# Patient Record
Sex: Female | Born: 1960 | Race: White | Hispanic: No | Marital: Single | Smoking: Current every day smoker
Health system: Southern US, Community
[De-identification: ages and names within clinical notes are randomized; demographics above are authoritative.]

## PROBLEM LIST (undated history)

## (undated) DIAGNOSIS — E079 Disorder of thyroid, unspecified: Secondary | ICD-10-CM

## (undated) DIAGNOSIS — Z9889 Other specified postprocedural states: Secondary | ICD-10-CM

## (undated) DIAGNOSIS — E039 Hypothyroidism, unspecified: Secondary | ICD-10-CM

## (undated) DIAGNOSIS — I10 Essential (primary) hypertension: Secondary | ICD-10-CM

## (undated) DIAGNOSIS — E0590 Thyrotoxicosis, unspecified without thyrotoxic crisis or storm: Secondary | ICD-10-CM

## (undated) DIAGNOSIS — R112 Nausea with vomiting, unspecified: Secondary | ICD-10-CM

## (undated) HISTORY — PX: NM THYROID STIM SUPRESS: HXRAD602

## (undated) HISTORY — PX: TUBAL LIGATION: SHX77

---

## 2002-08-19 ENCOUNTER — Emergency Department (HOSPITAL_COMMUNITY): Admission: EM | Admit: 2002-08-19 | Discharge: 2002-08-19 | Payer: Self-pay | Admitting: *Deleted

## 2003-08-28 ENCOUNTER — Emergency Department (HOSPITAL_COMMUNITY): Admission: EM | Admit: 2003-08-28 | Discharge: 2003-08-28 | Payer: Self-pay | Admitting: Emergency Medicine

## 2004-05-05 ENCOUNTER — Ambulatory Visit (HOSPITAL_COMMUNITY): Admission: RE | Admit: 2004-05-05 | Discharge: 2004-05-05 | Payer: Self-pay | Admitting: Family Medicine

## 2004-06-18 ENCOUNTER — Emergency Department (HOSPITAL_COMMUNITY): Admission: EM | Admit: 2004-06-18 | Discharge: 2004-06-18 | Payer: Self-pay | Admitting: Emergency Medicine

## 2005-03-17 ENCOUNTER — Encounter (HOSPITAL_COMMUNITY): Admission: RE | Admit: 2005-03-17 | Discharge: 2005-03-18 | Payer: Self-pay | Admitting: Family Medicine

## 2005-04-27 ENCOUNTER — Ambulatory Visit (HOSPITAL_COMMUNITY): Admission: RE | Admit: 2005-04-27 | Discharge: 2005-04-27 | Payer: Self-pay | Admitting: Family Medicine

## 2005-05-19 ENCOUNTER — Ambulatory Visit: Payer: Self-pay | Admitting: Endocrinology

## 2005-06-16 ENCOUNTER — Ambulatory Visit: Payer: Self-pay | Admitting: Endocrinology

## 2005-08-03 ENCOUNTER — Ambulatory Visit: Payer: Self-pay | Admitting: Endocrinology

## 2005-08-17 ENCOUNTER — Ambulatory Visit: Payer: Self-pay | Admitting: Endocrinology

## 2005-09-01 ENCOUNTER — Ambulatory Visit (HOSPITAL_COMMUNITY): Admission: RE | Admit: 2005-09-01 | Discharge: 2005-09-01 | Payer: Self-pay | Admitting: Family Medicine

## 2005-11-12 ENCOUNTER — Ambulatory Visit: Payer: Self-pay | Admitting: Endocrinology

## 2005-11-23 ENCOUNTER — Emergency Department (HOSPITAL_COMMUNITY): Admission: EM | Admit: 2005-11-23 | Discharge: 2005-11-23 | Payer: Self-pay | Admitting: Emergency Medicine

## 2005-11-25 ENCOUNTER — Ambulatory Visit: Payer: Self-pay | Admitting: Endocrinology

## 2005-12-07 ENCOUNTER — Encounter (HOSPITAL_COMMUNITY): Admission: RE | Admit: 2005-12-07 | Discharge: 2006-01-06 | Payer: Self-pay | Admitting: Endocrinology

## 2006-03-22 ENCOUNTER — Emergency Department (HOSPITAL_COMMUNITY): Admission: EM | Admit: 2006-03-22 | Discharge: 2006-03-22 | Payer: Self-pay | Admitting: Emergency Medicine

## 2006-04-13 ENCOUNTER — Ambulatory Visit (HOSPITAL_COMMUNITY): Admission: RE | Admit: 2006-04-13 | Discharge: 2006-04-13 | Payer: Self-pay | Admitting: Family Medicine

## 2006-05-31 ENCOUNTER — Ambulatory Visit (HOSPITAL_COMMUNITY): Admission: RE | Admit: 2006-05-31 | Discharge: 2006-05-31 | Payer: Self-pay | Admitting: Family Medicine

## 2006-07-01 ENCOUNTER — Ambulatory Visit: Payer: Self-pay | Admitting: Endocrinology

## 2006-07-01 LAB — CONVERTED CEMR LAB
Free T4: 1.4 ng/dL (ref 0.9–1.8)
TSH: 0.05 microintl units/mL — ABNORMAL LOW (ref 0.35–5.50)

## 2006-07-12 ENCOUNTER — Ambulatory Visit: Payer: Self-pay | Admitting: Endocrinology

## 2006-07-26 ENCOUNTER — Encounter (HOSPITAL_COMMUNITY): Admission: RE | Admit: 2006-07-26 | Discharge: 2006-08-15 | Payer: Self-pay | Admitting: Endocrinology

## 2006-12-11 ENCOUNTER — Emergency Department (HOSPITAL_COMMUNITY): Admission: EM | Admit: 2006-12-11 | Discharge: 2006-12-12 | Payer: Self-pay | Admitting: Emergency Medicine

## 2007-03-28 ENCOUNTER — Encounter: Payer: Self-pay | Admitting: Endocrinology

## 2007-03-28 DIAGNOSIS — I10 Essential (primary) hypertension: Secondary | ICD-10-CM | POA: Insufficient documentation

## 2007-05-30 ENCOUNTER — Encounter: Payer: Self-pay | Admitting: Endocrinology

## 2007-05-30 ENCOUNTER — Ambulatory Visit: Payer: Self-pay | Admitting: Endocrinology

## 2007-05-30 DIAGNOSIS — K219 Gastro-esophageal reflux disease without esophagitis: Secondary | ICD-10-CM | POA: Insufficient documentation

## 2007-05-30 DIAGNOSIS — E0590 Thyrotoxicosis, unspecified without thyrotoxic crisis or storm: Secondary | ICD-10-CM | POA: Insufficient documentation

## 2007-05-30 LAB — CONVERTED CEMR LAB
Alkaline Phosphatase: 45 units/L (ref 39–117)
Free T4: 0.8 ng/dL (ref 0.6–1.6)
TSH: 1.18 microintl units/mL (ref 0.35–5.50)

## 2007-06-01 ENCOUNTER — Telehealth: Payer: Self-pay | Admitting: Endocrinology

## 2007-06-02 ENCOUNTER — Ambulatory Visit (HOSPITAL_COMMUNITY): Admission: RE | Admit: 2007-06-02 | Discharge: 2007-06-02 | Payer: Self-pay | Admitting: Family Medicine

## 2007-06-06 ENCOUNTER — Encounter: Admission: RE | Admit: 2007-06-06 | Discharge: 2007-06-06 | Payer: Self-pay | Admitting: Endocrinology

## 2007-09-03 ENCOUNTER — Emergency Department (HOSPITAL_COMMUNITY): Admission: EM | Admit: 2007-09-03 | Discharge: 2007-09-03 | Payer: Self-pay | Admitting: Emergency Medicine

## 2007-10-17 ENCOUNTER — Emergency Department (HOSPITAL_COMMUNITY): Admission: EM | Admit: 2007-10-17 | Discharge: 2007-10-17 | Payer: Self-pay | Admitting: Emergency Medicine

## 2007-10-20 ENCOUNTER — Ambulatory Visit: Payer: Self-pay | Admitting: Endocrinology

## 2007-10-23 LAB — CONVERTED CEMR LAB: TSH: 1.22 microintl units/mL (ref 0.35–5.50)

## 2008-01-24 ENCOUNTER — Emergency Department (HOSPITAL_COMMUNITY): Admission: EM | Admit: 2008-01-24 | Discharge: 2008-01-24 | Payer: Self-pay | Admitting: Emergency Medicine

## 2008-05-29 ENCOUNTER — Emergency Department (HOSPITAL_COMMUNITY): Admission: EM | Admit: 2008-05-29 | Discharge: 2008-05-30 | Payer: Self-pay | Admitting: Emergency Medicine

## 2008-06-28 ENCOUNTER — Ambulatory Visit (HOSPITAL_COMMUNITY): Admission: RE | Admit: 2008-06-28 | Discharge: 2008-06-28 | Payer: Self-pay | Admitting: Family Medicine

## 2008-11-05 ENCOUNTER — Encounter: Payer: Self-pay | Admitting: Endocrinology

## 2008-11-28 ENCOUNTER — Ambulatory Visit: Payer: Self-pay | Admitting: Endocrinology

## 2008-11-28 DIAGNOSIS — E042 Nontoxic multinodular goiter: Secondary | ICD-10-CM | POA: Insufficient documentation

## 2008-12-05 ENCOUNTER — Encounter (HOSPITAL_COMMUNITY): Admission: RE | Admit: 2008-12-05 | Discharge: 2009-01-04 | Payer: Self-pay | Admitting: Endocrinology

## 2008-12-06 ENCOUNTER — Telehealth (INDEPENDENT_AMBULATORY_CARE_PROVIDER_SITE_OTHER): Payer: Self-pay | Admitting: *Deleted

## 2008-12-17 ENCOUNTER — Ambulatory Visit: Payer: Self-pay | Admitting: Endocrinology

## 2009-01-07 ENCOUNTER — Telehealth (INDEPENDENT_AMBULATORY_CARE_PROVIDER_SITE_OTHER): Payer: Self-pay | Admitting: *Deleted

## 2009-02-28 ENCOUNTER — Ambulatory Visit: Payer: Self-pay | Admitting: Endocrinology

## 2009-02-28 LAB — CONVERTED CEMR LAB
Free T4: 0.2 ng/dL — ABNORMAL LOW (ref 0.6–1.6)
TSH: 24.66 microintl units/mL — ABNORMAL HIGH (ref 0.35–5.50)

## 2009-03-06 ENCOUNTER — Emergency Department (HOSPITAL_COMMUNITY): Admission: EM | Admit: 2009-03-06 | Discharge: 2009-03-06 | Payer: Self-pay | Admitting: Emergency Medicine

## 2009-04-03 ENCOUNTER — Ambulatory Visit: Payer: Self-pay | Admitting: Internal Medicine

## 2009-04-03 DIAGNOSIS — E018 Other iodine-deficiency related thyroid disorders and allied conditions: Secondary | ICD-10-CM | POA: Insufficient documentation

## 2009-04-03 DIAGNOSIS — R5381 Other malaise: Secondary | ICD-10-CM | POA: Insufficient documentation

## 2009-04-03 DIAGNOSIS — R5383 Other fatigue: Secondary | ICD-10-CM

## 2009-04-04 LAB — CONVERTED CEMR LAB
ALT: 25 units/L (ref 0–35)
AST: 28 units/L (ref 0–37)
Albumin: 4.5 g/dL (ref 3.5–5.2)
Alkaline Phosphatase: 53 units/L (ref 39–117)
BUN: 13 mg/dL (ref 6–23)
Basophils Absolute: 0 10*3/uL (ref 0.0–0.1)
Basophils Relative: 0.1 % (ref 0.0–3.0)
Bilirubin Urine: NEGATIVE
Bilirubin, Direct: 0 mg/dL (ref 0.0–0.3)
CO2: 25 meq/L (ref 19–32)
Calcium: 9.3 mg/dL (ref 8.4–10.5)
Chloride: 106 meq/L (ref 96–112)
Creatinine, Ser: 1 mg/dL (ref 0.4–1.2)
Eosinophils Absolute: 0.1 10*3/uL (ref 0.0–0.7)
Eosinophils Relative: 1.4 % (ref 0.0–5.0)
GFR calc non Af Amer: 63.19 mL/min (ref >60)
Glucose, Bld: 83 mg/dL (ref 70–99)
HCT: 40.6 % (ref 36.0–46.0)
Hemoglobin, Urine: NEGATIVE
Hemoglobin: 13.7 g/dL (ref 12.0–15.0)
Ketones, ur: NEGATIVE mg/dL
Leukocytes, UA: NEGATIVE
Lymphocytes Relative: 20.5 % (ref 12.0–46.0)
Lymphs Abs: 1.3 10*3/uL (ref 0.7–4.0)
MCHC: 33.7 g/dL (ref 30.0–36.0)
MCV: 88.6 fL (ref 78.0–100.0)
Monocytes Absolute: 0.4 10*3/uL (ref 0.1–1.0)
Monocytes Relative: 7 % (ref 3.0–12.0)
Neutro Abs: 4.6 10*3/uL (ref 1.4–7.7)
Neutrophils Relative %: 71 % (ref 43.0–77.0)
Nitrite: NEGATIVE
Platelets: 145 10*3/uL — ABNORMAL LOW (ref 150.0–400.0)
Potassium: 3.9 meq/L (ref 3.5–5.1)
RBC: 4.58 M/uL (ref 3.87–5.11)
RDW: 17.6 % — ABNORMAL HIGH (ref 11.5–14.6)
Sodium: 138 meq/L (ref 135–145)
Specific Gravity, Urine: <=1.005 (ref 1.000–1.030)
TSH: 50.5 microintl units/mL — ABNORMAL HIGH (ref 0.35–5.50)
Total Bilirubin: 0.7 mg/dL (ref 0.3–1.2)
Total Protein, Urine: NEGATIVE mg/dL
Total Protein: 7.1 g/dL (ref 6.0–8.3)
Urine Glucose: NEGATIVE mg/dL
Urobilinogen, UA: 0.2 (ref 0.0–1.0)
WBC: 6.4 10*3/uL (ref 4.5–10.5)
pH: 6 (ref 5.0–8.0)

## 2009-04-28 ENCOUNTER — Ambulatory Visit: Payer: Self-pay | Admitting: Internal Medicine

## 2009-04-30 ENCOUNTER — Encounter (INDEPENDENT_AMBULATORY_CARE_PROVIDER_SITE_OTHER): Payer: Self-pay | Admitting: *Deleted

## 2009-05-01 LAB — CONVERTED CEMR LAB: TSH: 12.33 microintl units/mL — ABNORMAL HIGH (ref 0.35–5.50)

## 2009-10-09 ENCOUNTER — Ambulatory Visit (HOSPITAL_COMMUNITY): Admission: RE | Admit: 2009-10-09 | Discharge: 2009-10-09 | Payer: Self-pay | Admitting: Family Medicine

## 2009-11-03 ENCOUNTER — Other Ambulatory Visit: Admission: RE | Admit: 2009-11-03 | Discharge: 2009-11-03 | Payer: Self-pay | Admitting: Obstetrics & Gynecology

## 2010-06-23 ENCOUNTER — Encounter (HOSPITAL_COMMUNITY)
Admission: RE | Admit: 2010-06-23 | Discharge: 2010-07-23 | Payer: Self-pay | Source: Home / Self Care | Attending: Family Medicine | Admitting: Family Medicine

## 2010-06-30 ENCOUNTER — Ambulatory Visit (HOSPITAL_COMMUNITY): Admission: RE | Admit: 2010-06-30 | Discharge: 2010-06-30 | Payer: Self-pay | Admitting: Family Medicine

## 2010-07-07 ENCOUNTER — Ambulatory Visit (HOSPITAL_COMMUNITY): Admission: RE | Admit: 2010-07-07 | Payer: Self-pay | Admitting: Pediatrics

## 2010-07-24 ENCOUNTER — Encounter (HOSPITAL_COMMUNITY)
Admission: RE | Admit: 2010-07-24 | Discharge: 2010-08-23 | Payer: Self-pay | Source: Home / Self Care | Attending: Family Medicine | Admitting: Family Medicine

## 2010-08-20 ENCOUNTER — Emergency Department (HOSPITAL_COMMUNITY)
Admission: EM | Admit: 2010-08-20 | Discharge: 2010-08-20 | Payer: Self-pay | Source: Home / Self Care | Admitting: Emergency Medicine

## 2010-09-05 ENCOUNTER — Encounter: Payer: Self-pay | Admitting: Pediatrics

## 2010-09-06 ENCOUNTER — Encounter: Payer: Self-pay | Admitting: Endocrinology

## 2010-09-06 ENCOUNTER — Encounter: Payer: Self-pay | Admitting: Family Medicine

## 2010-09-07 ENCOUNTER — Encounter: Payer: Self-pay | Admitting: Endocrinology

## 2010-09-16 ENCOUNTER — Other Ambulatory Visit (HOSPITAL_COMMUNITY): Payer: Self-pay | Admitting: Pediatrics

## 2010-09-16 DIAGNOSIS — M542 Cervicalgia: Secondary | ICD-10-CM

## 2010-09-17 NOTE — Assessment & Plan Note (Signed)
   Vital Signs:  Patient Profile:   50 Years Old Female Weight:      216 pounds Temp:     97.4 degrees F Pulse rate:   75 / minute BP sitting:   151 / 99                 Visit Type:  f/u PCP:  mcgowen   History of Present Illness: patient was last seen here late in 2007.  She had a thyroid nuclear medicine scan showing diffuse uptake of 28% at 24 hours.  This is high normal. Symptomatically, she is having some palpitations, anxiety, and pain at her anterior neck.  She states that this pain may even be coming from her collarbones.she is currently taking metoprolol 25 mg twice a day   Review of Systems  The patient denies fever.    Current Allergies: No known allergies   Past Medical History:    Reviewed history from 03/28/2007 and no changes required:       Hypertension       GERD     Review of Systems  The patient denies fever.     Vital Signs:  Patient Profile:   50 Years Old Female Weight:      216 pounds Temp:     97.4 degrees F Pulse rate:   75 / minute BP sitting:   151 / 99   Physical Exam  General:     obese.   Neck:     thyroid is slightly enlarged, with a multi-nodular surface. 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 Take 1 by mouth qd     Problem # 2:  neck pain not thyroid-related   Patient Instructions: 1)  recheck thyroid US 2)  ret 6 mos    ] Laboratory Results  Comments: tsh=1.18 free t4=0.8 alk phos=45

## 2010-09-17 NOTE — Assessment & Plan Note (Signed)
Summary: FU Natale Milch $50   Vital Signs:  Patient profile:   50 year old female Height:      69 inches Weight:      187 pounds BMI:     27.71 Temp:     97.0 degrees F oral Pulse rate:   94 / minute BP sitting:   120 / 64  (left arm) Cuff size:   regular  Vitals Entered By: Bill Salinas CMA (Dec 17, 2008 9:03 AM)  Primary Gatsby Chismar:  mcgowen   History of Present Illness: i-131 rx id scheduled for 12/26/08.  she had restarted the tapazole.  she still feels bad.  she has palpiations, discomfort at the anterior neck, fatigue, and tremor.  Current Medications (verified): 1)  Verapamil Hcl Cr 120 Mg Xr24h-Cap (Verapamil Hcl) .Marland Kitchen.. 1 Tab Bid 2)  Methimazole 10 Mg Tabs (Methimazole) .... 2-Bid 3)  Multivitamin .Marland Kitchen.. 1 Daily  Allergies (verified): No Known Drug Allergies  Past History:  Past Medical History:    Hypertension    GERD     (05/30/2007)  Review of Systems  The patient denies fever.    Physical Exam  General:  normal appearance.   Neck:  5x normal size, multinodular goiter, right > left Lungs:  Clear to auscultation bilaterally. Normal respiratory effort.  Skin:  not diaphoretic Psych:  Alert and cooperative; normal mood and affect; normal attention span and concentration.     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 1 daily  Other Orders: Est. Patient Level III (91478)  Patient Instructions: 1)  please stop methimazole. 2)  have i-131 treatment on 12/26/08, as scheduled. 3)  restart methimazole 3-4 days later. 4)  return here mid-june.

## 2010-09-17 NOTE — Letter (Signed)
Summary: Generic Letter  [facility]  [address] [place], [place] [address]   Phone: [phone]  Fax: [phone]    04/30/2009  Mallory Smith [address] [place], [place]  [address]  Dear [name],   We have attempted unsuccessful to contact about you about some important information from Dr. Jonny Ruiz:    Tests: (1) TSH (TSH)   FastTSH              [H]  12.33 uIU/mL                0.35-5.50    Please call our office if you have any questions,  Thank You.           Sincerely,   Margaret Pyle, CMA

## 2010-09-17 NOTE — Progress Notes (Signed)
  Phone Note Call from Patient Call back at [facility] Phone [phone]   Caller: Daughter/kenra [phone] Call For: dr ellsion Summary of Call: per Sylvan Cheese daughter think she may have a allergic reaction to METHIMAZOLE 10 MG TABS (METHIMAZOLE).  have very  bad itching. no rash what should she do? Initial call taken by: Shelbie Proctor,  Jan 07, 2009 1:29 PM  Follow-up for Phone Call        change to ptu 4x50 mg two times a day if ever you have fever while taking this medication, stop it and call us, because of the risk of a rare side-effect  Follow-up by: Minus Breeding MD,  Jan 07, 2009 2:41 PM  Additional Follow-up for Phone Call Additional follow up Details #1::        Called pt to inform spoke with pt made aware Additional Follow-up by: Shelbie Proctor,  Jan 07, 2009 3:10 PM    New/Updated Medications: PROPYLTHIOURACIL 50 MG TABS (PROPYLTHIOURACIL) 4 tabs bid   Prescriptions: PROPYLTHIOURACIL 50 MG TABS (PROPYLTHIOURACIL) 4 tabs bid  #240 x 1   Entered and Authorized by:   Minus Breeding MD   Signed by:   Minus Breeding MD on 01/07/2009   Method used:   Electronically to        [facility] [phone]* (retail)       [address]       [place], [place]  [address]       Ph: [phone] or [phone]       Fax: [phone]   RxID:   [id number]

## 2010-09-17 NOTE — Progress Notes (Signed)
  Phone Note Call from Patient Call back at Home Phone [phone]   Caller: Patient Call For: dr ellsion  Summary of Call: per Mallory Smith call need a rx for  thyroid called in to her pharmacy .Marland KitchenMarland Kitchenpls send to [facility] in [place] [phone] pt listen to her report on pt Initial call taken by: Shelbie Proctor,  December 06, 2008 3:12 PM  Follow-up for Phone Call        sent if ever you have fever while taking this medication, stop it and call us, because of the risk of a rare side-effect  Follow-up by: Minus Breeding MD,  December 06, 2008 3:27 PM  Additional Follow-up for Phone Call Additional follow up Details #1::        called pt to inform that rx was sent to the pharmacy Additional Follow-up by: Shelbie Proctor,  December 09, 2008 11:29 AM    New/Updated Medications: METHIMAZOLE 10 MG TABS (METHIMAZOLE) 2-bid   Prescriptions: METHIMAZOLE 10 MG TABS (METHIMAZOLE) 2-bid  #120 x 1   Entered and Authorized by:   Minus Breeding MD   Signed by:   Minus Breeding MD on 12/06/2008   Method used:   Electronically to        [facility] [phone]* (retail)       [address]       [place], [place]  [address]       Ph: [phone] or [phone]       Fax: [phone]   RxID:   [id number]

## 2010-09-17 NOTE — Progress Notes (Signed)
Summary: results  Phone Note Call from Patient Call back at Home Phone [phone]   Caller: Patient Reason for Call: Lab or Test Results Summary of Call: Per pt calling to request her bone density results and lab test, only heard a thyroid report on phone tree from dr Everardo All Initial call taken by: Shelbie Proctor,  June 01, 2007 11:35 AM  Follow-up for Phone Call        on phone tree         Appended Document: results lm requarding  results on phone tree. called pt 06-02-07 @ 7:35am

## 2010-09-17 NOTE — Assessment & Plan Note (Signed)
Summary: FU  $50  D/T  STC   Vital Signs:  Patient profile:   50 year old female Height:      69 inches Weight:      188 pounds BMI:     27.86 O2 Sat:      98 % Temp:     96.9 degrees F oral Pulse rate:   105 / minute BP sitting:   130 / 82  (left arm) Cuff size:   regular  Vitals Entered By: Bill Salinas CMA (November 28, 2008 8:54 AM)  Primary Provider:  mcgowen   History of Present Illness: pt states palpitations, weight loss, and associated tremor of the hands.   says she takes methimazole as rx'ed.  Current Medications (verified): 1)  Verapamil Hcl Cr 120 Mg Xr24h-Cap (Verapamil Hcl) .Marland Kitchen.. 1 Tab Bid  Allergies (verified): No Known Drug Allergies  Past History:  Past Medical History:    Hypertension    GERD     (05/30/2007)  Review of Systems  The patient denies fever.    Physical Exam  General:  Well developed, well nourished, in no acute distress.  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 1 tab bid  Other Orders: Radiology Referral (Radiology) Radiology Referral (Radiology) Est. Patient Level III ([address])  Patient Instructions: 1)  scan, with plan for i-131.  there is some risk of exacerbation of her hyperthyroidism with the i-131 rx, but the risk of allowing her tft to be this high is greater 2)  we'll resume the methimazole few days after the treatment 3)  if ever you have fever while taking this medication, stop it and call us, because of the risk of a rare side-effect.

## 2010-09-17 NOTE — Assessment & Plan Note (Signed)
Summary: FU / NWS $50--rs' from bmp list/cd   Vital Signs:  Patient profile:   50 year old female Height:      69 inches Weight:      201.56 pounds BMI:     29.87 O2 Sat:      97 % Temp:     97.2 degrees F oral Pulse rate:   71 / minute BP sitting:   138 / 100  (right arm) Cuff size:   regular  Vitals Entered By: Windell Norfolk (April 03, 2009 9:24 AM) CC: discuss thyroid and BP   Primary Care Provider:  mcgowen  CC:  discuss thyroid and BP.  History of Present Illness: normally sees dr Everardo All who is out of the office due to new addition to the family; recent TSH elevated and thyroid med stopped; now with increasing fatigue, gained 20 lbs with less activitiy as well, denies OSA symptoms, Pt denies CP, sob, doe, wheezing, orthopnea, pnd, worsening LE edema, palps, dizziness or syncope Pt denies new neuro symptoms such as headache, facial or extremity weakness, although she does have some daytime somnolence (not clear if might be OSA) and some intermittent heavy menses recently she thinks maybe due to hormone changes.  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 1 Tab Bid 2)  Multivitamin .Marland Kitchen.. 1 Daily  Current Medications (verified): 1)  Losartan Potassium-Hctz 50-12.5 Mg Tabs (Losartan Potassium-Hctz) .Marland Kitchen.. 1 By Mouth Once Daily 2)  Multivitamin .Marland Kitchen.. 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 Married 2 daughters  Review of Systems       all otherwise negative - 12 system review done   Physical Exam  General:  alert and overweight-appearing.   Head:  normocephalic and atraumatic.   Eyes:  vision grossly intact, pupils equal, and pupils round.   Ears:  R ear normal and L ear normal.   Nose:  no external deformity and no nasal discharge.   Mouth:  no gingival abnormalities and pharynx pink and moist.   Neck:  supple and no masses.   Lungs:  normal respiratory effort and normal breath sounds.   Heart:  normal rate and regular rhythm.   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)  Orders: TLB-TSH (Thyroid Stimulating Hormone) (84443-TSH) will check TSH but likely needs med, will most likely need to start and f/u TSH 4 wks  Problem # 2:  FATIGUE (ICD-780.79)  to check labs - r/o anemia with recent irreg and heavy menses, TSH with above, blood sugar with the wt gain  Orders: TLB-BMP (Basic Metabolic Panel-BMET) (80048-METABOL) TLB-CBC Platelet - w/Differential (85025-CBCD) TLB-Hepatic/Liver Function Pnl (80076-HEPATIC) TLB-Udip w/ Micro (81001-URINE)  Problem # 3:  HYPERTENSION (ICD-401.9)  Her updated medication list for this problem includes:    Losartan Potassium-hctz 50-12.5 Mg Tabs (  Losartan potassium-hctz) .Marland Kitchen... 1 by mouth once daily  BP today: 138/100 Prior BP: 124/82 (02/28/2009) stable overall by hx and exam, ok to continue meds/tx as is   Complete Medication List: 1)  Losartan Potassium-hctz 50-12.5 Mg Tabs (Losartan potassium-hctz) .Marland Kitchen.. 1 by mouth once daily 2)  Multivitamin  .Marland Kitchen.. 1 daily  Patient Instructions: 1)  Please take all new medications as prescribed 2)  Please go to the Lab in the basement for  your blood and/or urine tests today 3)  Continue all previous medications as before this visit  4)  Please schedule an appointment with your primary doctor in 3 months 5)  Check your Blood Pressure regularly. If it is above 140/90: you should make an appointment. Prescriptions: LOSARTAN POTASSIUM-HCTZ 50-12.5 MG TABS (LOSARTAN POTASSIUM-HCTZ) 1 by mouth once daily  #30 x 11   Entered and Authorized by:   Corwin Levins MD   Signed by:   Corwin Levins MD on 04/03/2009   Method used:   Print then Give to Patient   RxID:   [id number]

## 2010-09-17 NOTE — Letter (Signed)
Summary: Out of Work  [facility]  [address] [place], [place] [address]   Phone: [phone]  Fax: [phone]    Dec 17, 2008   Employee:  Stephan Minister    To Whom It May Concern:   For Medical reasons, please excuse the above named employee from work for the following dates:  Start:   12/26/08  End:   12/30/08  If you need additional information, please feel free to contact our office.         Sincerely,    Minus Breeding MD

## 2010-09-17 NOTE — Assessment & Plan Note (Signed)
Summary: ER FU/NWS  $50   Vital Signs:  Patient Profile:   50 Years Old Female Weight:      214.6 pounds Temp:     97.6 degrees F oral Pulse rate:   69 / minute BP sitting:   159 / 89  (right arm) Cuff size:   large  Pt. in pain?   no  Vitals Entered By: Orlan Leavens (October 20, 2007 10:39 AM)                  PCP:  mcgowen  Chief Complaint:  ER FOLLOW-UP/DISCUSS MED.  History of Present Illness: patient was seen in the emergency room several days ago, with palpitations in the chest.  The symptoms persist.  she has lost few lbs since last ov.  pt  has taken herself off and on the beta blocker.    Current Allergies: No known allergies   Past Medical History:    Reviewed history from 05/30/2007 and no changes required:       Hypertension       GERD     Review of Systems  The patient denies fever and syncope.     Physical Exam  General:     healthy appearing.   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 Take 1 two times a day once daily  Orders: TLB-TSH (Thyroid Stimulating Hormone) (84443-TSH) Est. Patient Level III ([address])   Medications Added to Medication List This Visit: 1)  Metoprolol Tartrate 25 Mg Tabs (Metoprolol tartrate) .... Take 1 two times a day once daily   Patient Instructions: 1)  i told pt there is no thyroid reason to take beta-blocker. 2)  ret  6 mos 3)  cc dr Marvel Plan    ]  Appended Document: ER FU/NWS  $50 FAXED NOTES TO DR [facility] @ [phone]/LMB

## 2010-09-17 NOTE — Letter (Signed)
Summary: Generic Letter  [facility]  [address] [place], [place] [address]   Phone: [phone]  Fax: [phone]    04/30/2009  Mallory Smith [address] [place], [place]  [address]  Dear Ms. Mallory Smith,  We have attempted unsuccessfully to contact you about some very important information from Dr.John:   Test:   (1)   TSH   (TSH)    FastTSH         [H]   12.33  uIU/mL     0.35-5.50    1)   increase the thyroid med to 137 micrograms per day         2)   re-check TSH 4 wks  244.8   Pkease call our office if you have any questions, Thank You.       Sincerely,   Margaret Pyle, CMA

## 2010-09-17 NOTE — Assessment & Plan Note (Signed)
Summary: F/U NUCLEAR TEST/F/U APPT$50/CD-PER PT RS STC   Vital Signs:  Patient profile:   50 year old female Height:      69 inches Weight:      196 pounds BMI:     29.05 Temp:     97.5 degrees F oral Pulse rate:   82 / minute BP sitting:   124 / 82  (left arm) Cuff size:   regular  Vitals Entered By: Bill Salinas CMA (February 28, 2009 1:55 PM) CC: pt here for fu after radiation therapy and c/o fatigue ab   Primary Provider:  mcgowen  CC:  pt here for fu after radiation therapy and c/o fatigue ab.  History of Present Illness: see above.  now 8 weeks s/p i-131 rx for hyperthyroidism due to a multinodular goiter.  she feels as though she is retaining fluid.  Current Medications (verified): 1)  Verapamil Hcl Cr 120 Mg Xr24h-Cap (Verapamil Hcl) .Marland Kitchen.. 1 Tab Bid 2)  Multivitamin .Marland Kitchen.. 1 Daily 3)  Propylthiouracil 50 Mg Tabs (Propylthiouracil) .... 4 Tabs Bid 4)  Methimazole 10 Mg Tabs (Methimazole) .Marland Kitchen.. 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  The patient denies fever.    Physical Exam  General:  normal appearance.   Neck:  3x normal size, multinodular goiter, right > left Neurologic:  no tremor Skin:  not diaphoretic Psych:  Alert and cooperative; normal mood and affect; normal attention span and concentration.     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 1 tab two times a day  Other Orders: TLB-TSH (Thyroid Stimulating Hormone) (84443-TSH) TLB-T4 (Thyrox), Free  [phone]) Est. Patient Level III ([address])  Patient Instructions: 1)  Please schedule a follow-up appointment in 1 month. 2)  tests are being ordered for you today.  a few days after the test(s), please call [phone] to hear your test results.  this is very important to do because the results may change the instructions you see here 3)  stop methimazole 4)  (update: i left message on phone-tree:  rx as we discussed)

## 2010-09-21 ENCOUNTER — Ambulatory Visit (HOSPITAL_COMMUNITY)
Admission: RE | Admit: 2010-09-21 | Discharge: 2010-09-21 | Disposition: A | Discharge status: Home / Self Care | Payer: Commercial Managed Care - PPO | Source: Ambulatory Visit | Attending: Pediatrics | Admitting: Pediatrics

## 2010-09-21 DIAGNOSIS — M542 Cervicalgia: Secondary | ICD-10-CM | POA: Insufficient documentation

## 2010-09-21 DIAGNOSIS — M5380 Other specified dorsopathies, site unspecified: Secondary | ICD-10-CM | POA: Insufficient documentation

## 2010-10-15 ENCOUNTER — Ambulatory Visit (HOSPITAL_COMMUNITY)
Admission: RE | Admit: 2010-10-15 | Discharge: 2010-10-15 | Disposition: A | Discharge status: Home / Self Care | Payer: Commercial Managed Care - PPO | Source: Ambulatory Visit | Attending: Pediatrics | Admitting: Pediatrics

## 2010-10-15 DIAGNOSIS — M6281 Muscle weakness (generalized): Secondary | ICD-10-CM | POA: Insufficient documentation

## 2010-10-15 DIAGNOSIS — M546 Pain in thoracic spine: Secondary | ICD-10-CM | POA: Insufficient documentation

## 2010-10-15 DIAGNOSIS — IMO0001 Reserved for inherently not codable concepts without codable children: Secondary | ICD-10-CM | POA: Insufficient documentation

## 2010-10-19 ENCOUNTER — Ambulatory Visit (HOSPITAL_COMMUNITY)
Admission: RE | Admit: 2010-10-19 | Discharge: 2010-10-19 | Disposition: A | Discharge status: Home / Self Care | Payer: Commercial Managed Care - PPO | Source: Ambulatory Visit | Attending: *Deleted | Admitting: *Deleted

## 2010-10-21 ENCOUNTER — Ambulatory Visit (HOSPITAL_COMMUNITY): Payer: Commercial Managed Care - PPO | Admitting: *Deleted

## 2010-10-23 ENCOUNTER — Ambulatory Visit (HOSPITAL_COMMUNITY)
Admission: RE | Admit: 2010-10-23 | Discharge: 2010-10-23 | Disposition: A | Discharge status: Home / Self Care | Payer: Commercial Managed Care - PPO | Source: Ambulatory Visit | Attending: Pediatrics | Admitting: Pediatrics

## 2010-10-26 ENCOUNTER — Ambulatory Visit (HOSPITAL_COMMUNITY)
Admission: RE | Admit: 2010-10-26 | Discharge: 2010-10-26 | Disposition: A | Discharge status: Home / Self Care | Payer: Commercial Managed Care - PPO | Source: Ambulatory Visit | Attending: Pediatrics | Admitting: Pediatrics

## 2010-10-29 ENCOUNTER — Ambulatory Visit (HOSPITAL_COMMUNITY)
Admission: RE | Admit: 2010-10-29 | Discharge: 2010-10-29 | Disposition: A | Discharge status: Home / Self Care | Payer: Commercial Managed Care - PPO | Source: Ambulatory Visit | Attending: *Deleted | Admitting: *Deleted

## 2010-10-30 ENCOUNTER — Ambulatory Visit (HOSPITAL_COMMUNITY)
Admission: RE | Admit: 2010-10-30 | Discharge: 2010-10-30 | Disposition: A | Discharge status: Home / Self Care | Payer: Commercial Managed Care - PPO | Source: Ambulatory Visit | Attending: *Deleted | Admitting: *Deleted

## 2010-11-02 ENCOUNTER — Ambulatory Visit (HOSPITAL_COMMUNITY)
Admission: RE | Admit: 2010-11-02 | Discharge: 2010-11-02 | Disposition: A | Discharge status: Home / Self Care | Payer: Commercial Managed Care - PPO | Source: Ambulatory Visit | Attending: *Deleted | Admitting: *Deleted

## 2010-11-05 ENCOUNTER — Ambulatory Visit (HOSPITAL_COMMUNITY): Payer: Commercial Managed Care - PPO

## 2010-11-06 ENCOUNTER — Ambulatory Visit (HOSPITAL_COMMUNITY): Payer: Commercial Managed Care - PPO

## 2010-11-09 ENCOUNTER — Ambulatory Visit (HOSPITAL_COMMUNITY)
Admission: RE | Admit: 2010-11-09 | Discharge: 2010-11-09 | Disposition: A | Discharge status: Home / Self Care | Payer: Commercial Managed Care - PPO | Source: Ambulatory Visit | Attending: *Deleted | Admitting: *Deleted

## 2010-11-11 ENCOUNTER — Ambulatory Visit (HOSPITAL_COMMUNITY)
Admission: RE | Admit: 2010-11-11 | Discharge: 2010-11-11 | Disposition: A | Discharge status: Home / Self Care | Payer: Commercial Managed Care - PPO | Source: Ambulatory Visit | Attending: *Deleted | Admitting: *Deleted

## 2010-11-13 ENCOUNTER — Ambulatory Visit (HOSPITAL_COMMUNITY): Payer: Commercial Managed Care - PPO | Admitting: Physical Therapy

## 2010-11-16 ENCOUNTER — Ambulatory Visit (HOSPITAL_COMMUNITY)
Admission: RE | Admit: 2010-11-16 | Discharge: 2010-11-16 | Disposition: A | Discharge status: Home / Self Care | Payer: Commercial Managed Care - PPO | Source: Ambulatory Visit | Attending: Pediatrics | Admitting: Pediatrics

## 2010-11-16 DIAGNOSIS — M546 Pain in thoracic spine: Secondary | ICD-10-CM | POA: Insufficient documentation

## 2010-11-16 DIAGNOSIS — M6281 Muscle weakness (generalized): Secondary | ICD-10-CM | POA: Insufficient documentation

## 2010-11-16 DIAGNOSIS — IMO0001 Reserved for inherently not codable concepts without codable children: Secondary | ICD-10-CM | POA: Insufficient documentation

## 2010-11-17 ENCOUNTER — Ambulatory Visit (HOSPITAL_COMMUNITY)
Admission: RE | Admit: 2010-11-17 | Discharge: 2010-11-17 | Disposition: A | Discharge status: Home / Self Care | Payer: Commercial Managed Care - PPO | Source: Ambulatory Visit

## 2010-11-18 ENCOUNTER — Ambulatory Visit (HOSPITAL_COMMUNITY)
Admission: RE | Admit: 2010-11-18 | Discharge: 2010-11-18 | Disposition: A | Discharge status: Home / Self Care | Payer: Commercial Managed Care - PPO | Source: Ambulatory Visit | Attending: Pediatrics | Admitting: Pediatrics

## 2010-11-18 DIAGNOSIS — IMO0001 Reserved for inherently not codable concepts without codable children: Secondary | ICD-10-CM | POA: Insufficient documentation

## 2010-11-18 DIAGNOSIS — M546 Pain in thoracic spine: Secondary | ICD-10-CM | POA: Insufficient documentation

## 2010-11-18 DIAGNOSIS — M6281 Muscle weakness (generalized): Secondary | ICD-10-CM | POA: Insufficient documentation

## 2010-11-24 ENCOUNTER — Ambulatory Visit (HOSPITAL_COMMUNITY)
Admission: RE | Admit: 2010-11-24 | Discharge: 2010-11-24 | Disposition: A | Discharge status: Home / Self Care | Payer: Commercial Managed Care - PPO | Source: Ambulatory Visit | Attending: *Deleted | Admitting: *Deleted

## 2010-11-25 ENCOUNTER — Ambulatory Visit (HOSPITAL_COMMUNITY)
Admission: RE | Admit: 2010-11-25 | Discharge: 2010-11-25 | Disposition: A | Discharge status: Home / Self Care | Payer: Commercial Managed Care - PPO | Source: Ambulatory Visit

## 2010-11-26 ENCOUNTER — Ambulatory Visit (HOSPITAL_COMMUNITY): Payer: Commercial Managed Care - PPO

## 2010-12-01 ENCOUNTER — Ambulatory Visit (HOSPITAL_COMMUNITY)
Admission: RE | Admit: 2010-12-01 | Discharge: 2010-12-01 | Disposition: A | Discharge status: Home / Self Care | Payer: Commercial Managed Care - PPO | Source: Ambulatory Visit | Attending: Family Medicine | Admitting: Family Medicine

## 2010-12-03 ENCOUNTER — Ambulatory Visit (HOSPITAL_COMMUNITY): Payer: Commercial Managed Care - PPO

## 2010-12-08 ENCOUNTER — Ambulatory Visit (HOSPITAL_COMMUNITY)
Admission: RE | Admit: 2010-12-08 | Discharge: 2010-12-08 | Disposition: A | Discharge status: Home / Self Care | Payer: Commercial Managed Care - PPO | Source: Ambulatory Visit | Attending: Family Medicine | Admitting: Family Medicine

## 2010-12-10 ENCOUNTER — Ambulatory Visit (HOSPITAL_COMMUNITY)
Admission: RE | Admit: 2010-12-10 | Discharge: 2010-12-10 | Disposition: A | Discharge status: Home / Self Care | Payer: Commercial Managed Care - PPO | Source: Ambulatory Visit | Attending: Family Medicine | Admitting: Family Medicine

## 2010-12-11 ENCOUNTER — Ambulatory Visit (HOSPITAL_COMMUNITY): Payer: Commercial Managed Care - PPO

## 2010-12-15 ENCOUNTER — Ambulatory Visit (HOSPITAL_COMMUNITY): Payer: Commercial Managed Care - PPO

## 2010-12-17 ENCOUNTER — Ambulatory Visit (HOSPITAL_COMMUNITY)
Admission: RE | Admit: 2010-12-17 | Discharge: 2010-12-17 | Disposition: A | Discharge status: Home / Self Care | Payer: Commercial Managed Care - PPO | Source: Ambulatory Visit | Attending: Pediatrics | Admitting: Pediatrics

## 2010-12-17 DIAGNOSIS — M6281 Muscle weakness (generalized): Secondary | ICD-10-CM | POA: Insufficient documentation

## 2010-12-17 DIAGNOSIS — M546 Pain in thoracic spine: Secondary | ICD-10-CM | POA: Insufficient documentation

## 2010-12-17 DIAGNOSIS — IMO0001 Reserved for inherently not codable concepts without codable children: Secondary | ICD-10-CM | POA: Insufficient documentation

## 2011-01-01 NOTE — Consult Note (Signed)
[facility]                          ENDOCRINOLOGY CONSULTATION   NAME:Mallory Smith, Mallory Smith                         MRN:          [id number]  DATE:07/12/2006                            DOB:          Jan 04, 1961    REASON FOR VISIT:  Followup fibroid.   HISTORY OF PRESENT ILLNESS:  A 50 year old woman who was recently found  by Dr. Milinda Cave to have recurrent hyperthyroidism and was started about a  week ago on Tapazole 5 mg a day.  She has a few symptoms of tremor and  palpitations.   PAST MEDICAL HISTORY:  Hypertension, for which she takes Mavik.   REVIEW OF SYSTEMS:  She has lost 20 pounds over the past few months.   PHYSICAL EXAMINATION:  VITAL SIGNS:  Blood pressure 130/82, heart rate  79, temperature 97.1, weight 198.  GENERAL:  In no distress.  NECK:  Thyroid is 2 times normal size bilaterally with an irregular  surface.   LABORATORIES:  Laboratory studies forwards by Dr. Milinda Cave on June 28, 2006 - free T4 elevated at 2.02, TSH low at 0.009.   IMPRESSION:  Recurrent hyperthyroidism.   PLAN:  1. I discussed treatment options with the patient, and she states at      this time she would like to do iodine-131 therapy.  Therefore, she      will discontinue the Tapazole, and I will send her for a nuclear      medicine scan.  She would like to hold off on getting the treatment      until January of 2008 when her work schedule slows down.  2. Return about 2 weeks later.  3. Lopressor 25 mg twice a day to control symptoms in the meantime.     Sean A. Everardo All, MD  Electronically Signed    SAE/MedQ  DD: 07/12/2006  DT: 07/12/2006  Job #: [id number]   cc:   Jeoffrey Massed, MD

## 2011-05-06 LAB — URINALYSIS, ROUTINE W REFLEX MICROSCOPIC
Bilirubin Urine: NEGATIVE
Glucose, UA: NEGATIVE
Hgb urine dipstick: NEGATIVE
Ketones, ur: NEGATIVE
Nitrite: NEGATIVE
Protein, ur: NEGATIVE
Specific Gravity, Urine: <1.005 — ABNORMAL LOW
Urobilinogen, UA: 0.2
pH: 7

## 2011-05-10 LAB — CBC
HCT: 39.5
Hemoglobin: 13
MCHC: 32.9
MCV: 83.9
Platelets: 179
RBC: 4.71
RDW: 14.6
WBC: 8.2

## 2011-05-10 LAB — DIFFERENTIAL
Basophils Absolute: 0.1
Basophils Relative: 2 — ABNORMAL HIGH
Eosinophils Absolute: 0.1
Eosinophils Relative: 2
Lymphocytes Relative: 28
Lymphs Abs: 2.3
Monocytes Absolute: 0.7
Monocytes Relative: 8
Neutro Abs: 5
Neutrophils Relative %: 61

## 2011-05-10 LAB — COMPREHENSIVE METABOLIC PANEL
ALT: 14
AST: 17
Albumin: 4
Alkaline Phosphatase: 43
BUN: 12
CO2: 25
Calcium: 9
Chloride: 104
Creatinine, Ser: 1.07
GFR calc Af Amer: >60
GFR calc non Af Amer: 55 — ABNORMAL LOW
Glucose, Bld: 92
Potassium: 3.8
Sodium: 136
Total Bilirubin: 0.5
Total Protein: 6.9

## 2011-05-10 LAB — POCT CARDIAC MARKERS
CKMB, poc: 2
Myoglobin, poc: 67.2
Operator id: 217151
Troponin i, poc: <0.05

## 2011-10-13 ENCOUNTER — Other Ambulatory Visit (HOSPITAL_COMMUNITY): Payer: Self-pay | Admitting: Pediatrics

## 2011-10-13 DIAGNOSIS — Z9889 Other specified postprocedural states: Secondary | ICD-10-CM

## 2011-10-15 ENCOUNTER — Other Ambulatory Visit (HOSPITAL_COMMUNITY): Payer: Commercial Managed Care - PPO

## 2011-10-19 ENCOUNTER — Other Ambulatory Visit (HOSPITAL_COMMUNITY): Payer: Commercial Managed Care - PPO

## 2011-10-26 ENCOUNTER — Other Ambulatory Visit (HOSPITAL_COMMUNITY): Payer: Self-pay

## 2011-11-02 ENCOUNTER — Other Ambulatory Visit (HOSPITAL_COMMUNITY): Payer: Self-pay

## 2011-11-05 ENCOUNTER — Ambulatory Visit (HOSPITAL_COMMUNITY)
Admission: RE | Admit: 2011-11-05 | Discharge: 2011-11-05 | Disposition: A | Discharge status: Home / Self Care | Payer: 59 | Source: Ambulatory Visit | Attending: Pediatrics | Admitting: Pediatrics

## 2011-11-05 DIAGNOSIS — Z09 Encounter for follow-up examination after completed treatment for conditions other than malignant neoplasm: Secondary | ICD-10-CM | POA: Insufficient documentation

## 2011-11-05 DIAGNOSIS — Z9889 Other specified postprocedural states: Secondary | ICD-10-CM | POA: Insufficient documentation

## 2011-11-05 DIAGNOSIS — E042 Nontoxic multinodular goiter: Secondary | ICD-10-CM | POA: Insufficient documentation

## 2012-01-04 ENCOUNTER — Encounter (HOSPITAL_COMMUNITY): Payer: Self-pay

## 2012-01-04 ENCOUNTER — Emergency Department (HOSPITAL_COMMUNITY)
Admission: EM | Admit: 2012-01-04 | Discharge: 2012-01-04 | Disposition: A | Discharge status: Home / Self Care | Payer: 59 | Attending: Emergency Medicine | Admitting: Emergency Medicine

## 2012-01-04 ENCOUNTER — Emergency Department (HOSPITAL_COMMUNITY): Payer: 59

## 2012-01-04 DIAGNOSIS — R079 Chest pain, unspecified: Secondary | ICD-10-CM | POA: Insufficient documentation

## 2012-01-04 DIAGNOSIS — F17200 Nicotine dependence, unspecified, uncomplicated: Secondary | ICD-10-CM | POA: Insufficient documentation

## 2012-01-04 DIAGNOSIS — R05 Cough: Secondary | ICD-10-CM | POA: Insufficient documentation

## 2012-01-04 DIAGNOSIS — Z79899 Other long term (current) drug therapy: Secondary | ICD-10-CM | POA: Insufficient documentation

## 2012-01-04 DIAGNOSIS — I10 Essential (primary) hypertension: Secondary | ICD-10-CM | POA: Insufficient documentation

## 2012-01-04 DIAGNOSIS — M549 Dorsalgia, unspecified: Secondary | ICD-10-CM | POA: Insufficient documentation

## 2012-01-04 DIAGNOSIS — J3489 Other specified disorders of nose and nasal sinuses: Secondary | ICD-10-CM | POA: Insufficient documentation

## 2012-01-04 DIAGNOSIS — S20219A Contusion of unspecified front wall of thorax, initial encounter: Secondary | ICD-10-CM | POA: Insufficient documentation

## 2012-01-04 DIAGNOSIS — IMO0001 Reserved for inherently not codable concepts without codable children: Secondary | ICD-10-CM | POA: Insufficient documentation

## 2012-01-04 DIAGNOSIS — W1809XA Striking against other object with subsequent fall, initial encounter: Secondary | ICD-10-CM | POA: Insufficient documentation

## 2012-01-04 DIAGNOSIS — R059 Cough, unspecified: Secondary | ICD-10-CM | POA: Insufficient documentation

## 2012-01-04 DIAGNOSIS — E079 Disorder of thyroid, unspecified: Secondary | ICD-10-CM | POA: Insufficient documentation

## 2012-01-04 HISTORY — DX: Disorder of thyroid, unspecified: E07.9

## 2012-01-04 HISTORY — DX: Essential (primary) hypertension: I10

## 2012-01-04 LAB — URINALYSIS, ROUTINE W REFLEX MICROSCOPIC
Bilirubin Urine: NEGATIVE
Glucose, UA: NEGATIVE mg/dL
Ketones, ur: NEGATIVE mg/dL
Leukocytes, UA: NEGATIVE
Nitrite: NEGATIVE
Protein, ur: NEGATIVE mg/dL
Specific Gravity, Urine: 1.005 (ref 1.005–1.030)
Urobilinogen, UA: 0.2 mg/dL (ref 0.0–1.0)
pH: 7 (ref 5.0–8.0)

## 2012-01-04 LAB — URINE MICROSCOPIC-ADD ON

## 2012-01-04 MED ORDER — HYDROCODONE-ACETAMINOPHEN 5-325 MG PO TABS: 1.0000 | ORAL_TABLET | Freq: Four times a day (QID) | ORAL | Status: AC | PRN

## 2012-01-04 MED ORDER — IBUPROFEN 800 MG PO TABS
800.0000 mg | ORAL_TABLET | Freq: Once | ORAL | Status: AC
  Administered 2012-01-04: 800 mg via ORAL
  Filled 2012-01-04: qty 1

## 2012-01-04 NOTE — ED Provider Notes (Signed)
Medical screening examination/treatment/procedure(s) were performed by non-physician practitioner and as supervising physician I was immediately available for consultation/collaboration.   Merrilee Ancona W Marionette Meskill, MD 01/04/12 1115 

## 2012-01-04 NOTE — ED Provider Notes (Signed)
History     CSN: [id number]  Arrival date & time 01/04/12  0865   First MD Initiated Contact with Patient 01/04/12 [phone]      Chief Complaint  Patient presents with  . Back Pain  . Nasal Congestion    (Consider location/radiation/quality/duration/timing/severity/associated sxs/prior treatment) HPI Comments: Tripped over electric cord and fell striking L posterior chest on bath tub.  She has also had a NP cough and nasal congestion ~ 5 months.  PCP is dr. Milford Cage.  Patient is a 51 y.o. female presenting with back pain. The history is provided by the patient. No language interpreter was used.  Back Pain  This is a new problem. The current episode started 2 days ago. The problem occurs constantly. The problem has not changed since onset.The pain is associated with falling. Pain location: L CVA region. The quality of the pain is described as stabbing. The pain does not radiate. The pain is moderate. The symptoms are aggravated by bending (also deep inspiration and palpation). The pain is the same all the time. Associated symptoms include chest pain. Pertinent negatives include no fever and no dysuria. She has tried nothing for the symptoms.    Past Medical History  Diagnosis Date  . Hypertension   . Thyroid disease     Past Surgical History  Procedure Date  . Tubal ligation   . Nm thyroid stim supress     No family history on file.  History  Substance Use Topics  . Smoking status: Current Everyday Smoker    Types: Cigarettes  . Smokeless tobacco: Not on file  . Alcohol Use: Yes     occ    OB History    Grav Para Term Preterm Abortions TAB SAB Ect Mult Living                  Review of Systems  Constitutional: Negative for fever.  HENT: Positive for congestion.   Respiratory: Positive for cough.   Cardiovascular: Positive for chest pain.  Genitourinary: Negative for dysuria, urgency, frequency and hematuria.  Musculoskeletal: Positive for back pain.  All other  systems reviewed and are negative.    Allergies  Review of patient's allergies indicates no known allergies.  Home Medications   Current Outpatient Rx  Name Route Sig Dispense Refill  . ACETAMINOPHEN 500 MG PO TABS Oral Take 1,000 mg by mouth every 6 (six) hours as needed. FOR PAIN    . FLUTICASONE PROPIONATE 50 MCG/ACT NA SUSP Nasal Place 2 sprays into the nose daily as needed. FOR CONGESTION    . GUAIFENESIN ER 600 MG PO TB12 Oral Take 600 mg by mouth 2 (two) times daily as needed. FOR CONGESTION    . LEVOTHYROXINE SODIUM 150 MCG PO TABS Oral Take 150 mcg by mouth daily before breakfast.    . MEGESTROL ACETATE 40 MG PO TABS Oral Take 40 mg by mouth every evening.    Marland Kitchen METOPROLOL SUCCINATE ER 50 MG PO TB24 Oral Take 50 mg by mouth every evening. Take with or immediately following a meal.    . VITAMIN B-12 1000 MCG PO TABS Oral Take 1,000 mcg by mouth every evening.    Marland Kitchen VITAMIN E 400 UNITS PO CAPS Oral Take 400 Units by mouth every evening.    Marland Kitchen HYDROCODONE-ACETAMINOPHEN 5-325 MG PO TABS Oral Take 1 tablet by mouth every 6 (six) hours as needed for pain. 20 tablet 0    BP 192/106  Pulse 70  Temp(Src) 98 F (  36.7 C) (Oral)  Resp 20  Ht 5\' 9"  (1.753 m)  Wt 200 lb (90.719 kg)  BMI 29.53 kg/m2  SpO2 100%  Physical Exam  Nursing note and vitals reviewed. Constitutional: She is oriented to person, place, and time. She appears well-developed and well-nourished. No distress.  HENT:  Head: Normocephalic and atraumatic.  Eyes: EOM are normal.  Neck: Normal range of motion.  Cardiovascular: Normal rate, regular rhythm and normal heart sounds.   Pulmonary/Chest: Effort normal and breath sounds normal. No accessory muscle usage. Not tachypneic. No respiratory distress. She has no wheezes. She has no rhonchi. She has no rales.   She exhibits tenderness.  Abdominal: Soft. She exhibits no distension. There is no tenderness.  Musculoskeletal: Normal range of motion. She exhibits  tenderness.  Neurological: She is alert and oriented to person, place, and time.  Skin: Skin is warm and dry.  Psychiatric: She has a normal mood and affect. Judgment normal.    ED Course  Procedures (including critical care time)  Labs Reviewed  URINALYSIS, ROUTINE W REFLEX MICROSCOPIC - Abnormal; Notable for the following:    Hgb urine dipstick SMALL (*)    All other components within normal limits  URINE MICROSCOPIC-ADD ON - Abnormal; Notable for the following:    Squamous Epithelial / LPF MANY (*)    All other components within normal limits   Dg Ribs Unilateral W/chest Left  01/04/2012  *RADIOLOGY REPORT*  Clinical Data: Back and posterior left rib pain, fell 2 days ago striking back, and angio bathtub, coughing for approximately 5 months  LEFT RIBS AND CHEST - 3+ VIEW  Comparison: Chest radiograph 10/09/2009  Findings: Upper-normal size of cardiac silhouette. Mediastinal contours and pulmonary vascularity normal. Lungs clear. No pleural effusion or pneumothorax. Biconvex thoracic scoliosis. Osseous demineralization. No definite rib fracture or bone destruction seen.  IMPRESSION: No acute abnormalities.  Original Report Authenticated By: Lollie Marrow, M.D.     1. Contusion of chest wall       MDM  No hematuria.  No rib fracture identified.  Ice, ibuprofen.  rx hydrocodone ,         Worthy Rancher, [place] 01/04/12 1019

## 2012-01-04 NOTE — ED Notes (Signed)
1. Pt reports back pain since falling on Sunday, tripped over cord., denies loc 2. nasla congestion and cough since decemeber

## 2012-01-04 NOTE — Discharge Instructions (Signed)
Chest Contusion A contusion is a deep bruise. Bruises happen when an injury causes bleeding under the skin. Signs of bruising include pain, puffiness (swelling), and discolored skin. The bruise may turn blue, purple, or yellow. Pay attention to how you are doing. HOME CARE  Put ice on the injured area.   Put ice in a plastic bag.   Place a towel between the skin and the bag.   Leave the ice on for 15 to 20 minutes at a time, 3 to 4 times a day for the first 48 hours.   Rest.   Do not lift anything heavy.   Limit your activity as told by your doctor   Take 3 to 4 deep breaths every hour while awake. Hold your hand or a pillow over the sore area for support.   Breathe from the belly (abdomen).   Breathe in through the nose, as if you are smelling a flower.   Breathe out through the mouth, as if you are blowing out a candle.   Only take medicine as told by your doctor.  GET HELP RIGHT AWAY IF:   You have trouble breathing or cough up thick spit (mucus).   You have chest pain that goes into the arms or jaw.   The skin is wet and pale.   You have a fever.   You feel dizzy, weak, or pass out (faint).   You cannot breathe easily.   The bruise is getting worse.  MAKE SURE YOU:   Understand these instructions.   Will watch your condition.   Will get help right away if you are not doing well or get worse.  Document Released: 01/19/2008 Document Revised: 07/22/2011 Document Reviewed: 01/19/2008 [facility] Patient Information 2012 [place], [place].Cryotherapy Cryotherapy means treatment with cold. Ice or gel packs can be used to reduce both pain and swelling. Ice is the most helpful within the first 24 to 48 hours after an injury or flareup from overusing a muscle or joint. Sprains, strains, spasms, burning pain, shooting pain, and aches can all be eased with ice. Ice can also be used when recovering from surgery. Ice is effective, has very few side effects, and is safe for most  people to use. PRECAUTIONS  Ice is not a safe treatment option for people with:  Raynaud's phenomenon. This is a condition affecting small blood vessels in the extremities. Exposure to cold may cause your problems to return.   Cold hypersensitivity. There are many forms of cold hypersensitivity, including:   Cold urticaria. Red, itchy hives appear on the skin when the tissues begin to warm after being iced.   Cold erythema. This is a red, itchy rash caused by exposure to cold.   Cold hemoglobinuria. Red blood cells break down when the tissues begin to warm after being iced. The hemoglobin that carry oxygen are passed into the urine because they cannot combine with blood proteins fast enough.   Numbness or altered sensitivity in the area being iced.  If you have any of the following conditions, do not use ice until you have discussed cryotherapy with your caregiver:  Heart conditions, such as arrhythmia, angina, or chronic heart disease.   High blood pressure.   Healing wounds or open skin in the area being iced.   Current infections.   Rheumatoid arthritis.   Poor circulation.   Diabetes.  Ice slows the blood flow in the region it is applied. This is beneficial when trying to stop inflamed tissues from spreading  irritating chemicals to surrounding tissues. However, if you expose your skin to cold temperatures for too long or without the proper protection, you can damage your skin or nerves. Watch for signs of skin damage due to cold. HOME CARE INSTRUCTIONS Follow these tips to use ice and cold packs safely.  Place a dry or damp towel between the ice and skin. A damp towel will cool the skin more quickly, so you may need to shorten the time that the ice is used.   For a more rapid response, add gentle compression to the ice.   Ice for no more than 10 to 20 minutes at a time. The bonier the area you are icing, the less time it will take to get the benefits of ice.   Check your  skin after 5 minutes to make sure there are no signs of a poor response to cold or skin damage.   Rest 20 minutes or more in between uses.   Once your skin is numb, you can end your treatment. You can test numbness by very lightly touching your skin. The touch should be so light that you do not see the skin dimple from the pressure of your fingertip. When using ice, most people will feel these normal sensations in this order: cold, burning, aching, and numbness.   Do not use ice on someone who cannot communicate their responses to pain, such as small children or people with dementia.  HOW TO MAKE AN ICE PACK Ice packs are the most common way to use ice therapy. Other methods include ice massage, ice baths, and cryo-sprays. Muscle creams that cause a cold, tingly feeling do not offer the same benefits that ice offers and should not be used as a substitute unless recommended by your caregiver. To make an ice pack, do one of the following:  Place crushed ice or a bag of frozen vegetables in a sealable plastic bag. Squeeze out the excess air. Place this bag inside another plastic bag. Slide the bag into a pillowcase or place a damp towel between your skin and the bag.   Mix 3 parts water with 1 part rubbing alcohol. Freeze the mixture in a sealable plastic bag. When you remove the mixture from the freezer, it will be slushy. Squeeze out the excess air. Place this bag inside another plastic bag. Slide the bag into a pillowcase or place a damp towel between your skin and the bag.  SEEK MEDICAL CARE IF:  You develop white spots on your skin. This may give the skin a blotchy (mottled) appearance.   Your skin turns blue or pale.   Your skin becomes waxy or hard.   Your swelling gets worse.  MAKE SURE YOU:   Understand these instructions.   Will watch your condition.   Will get help right away if you are not doing well or get worse.  Document Released: 03/29/2011 Document Revised: 07/22/2011  Document Reviewed: 03/29/2011 [facility] Patient Information 2012 [place], [place].   The chest x-rays reveal no apparent rib fractures.  The urinalysis is normal.  Take the pain medicine as directed and ibuprofen 800 mg every 8 hrs with food.  Return if any cough, fever or obvious blood in urine.Marland Kitchen

## 2012-07-07 ENCOUNTER — Emergency Department (HOSPITAL_COMMUNITY): Payer: 59

## 2012-07-07 ENCOUNTER — Emergency Department (HOSPITAL_COMMUNITY)
Admission: EM | Admit: 2012-07-07 | Discharge: 2012-07-07 | Disposition: A | Discharge status: Home / Self Care | Payer: 59 | Attending: Emergency Medicine | Admitting: Emergency Medicine

## 2012-07-07 ENCOUNTER — Encounter (HOSPITAL_COMMUNITY): Payer: Self-pay | Admitting: *Deleted

## 2012-07-07 ENCOUNTER — Other Ambulatory Visit: Payer: Self-pay

## 2012-07-07 DIAGNOSIS — I10 Essential (primary) hypertension: Secondary | ICD-10-CM | POA: Insufficient documentation

## 2012-07-07 DIAGNOSIS — M542 Cervicalgia: Secondary | ICD-10-CM | POA: Insufficient documentation

## 2012-07-07 DIAGNOSIS — F17200 Nicotine dependence, unspecified, uncomplicated: Secondary | ICD-10-CM | POA: Insufficient documentation

## 2012-07-07 DIAGNOSIS — E079 Disorder of thyroid, unspecified: Secondary | ICD-10-CM | POA: Insufficient documentation

## 2012-07-07 DIAGNOSIS — Z79899 Other long term (current) drug therapy: Secondary | ICD-10-CM | POA: Insufficient documentation

## 2012-07-07 DIAGNOSIS — R091 Pleurisy: Secondary | ICD-10-CM | POA: Insufficient documentation

## 2012-07-07 DIAGNOSIS — J40 Bronchitis, not specified as acute or chronic: Secondary | ICD-10-CM

## 2012-07-07 LAB — D-DIMER, QUANTITATIVE: D-Dimer, Quant: <0.27 ug/mL-FEU (ref 0.00–0.48)

## 2012-07-07 MED ORDER — DOXYCYCLINE HYCLATE 100 MG PO CAPS: 100.0000 mg | ORAL_CAPSULE | Freq: Two times a day (BID) | ORAL | Status: DC

## 2012-07-07 MED ORDER — ALBUTEROL SULFATE HFA 108 (90 BASE) MCG/ACT IN AERS
INHALATION_SPRAY | RESPIRATORY_TRACT | Status: AC
  Filled 2012-07-07: qty 6.7

## 2012-07-07 MED ORDER — ALBUTEROL SULFATE HFA 108 (90 BASE) MCG/ACT IN AERS
2.0000 | INHALATION_SPRAY | Freq: Once | RESPIRATORY_TRACT | Status: AC
  Administered 2012-07-07: 2 via RESPIRATORY_TRACT
  Filled 2012-07-07: qty 6.7

## 2012-07-07 MED ORDER — KETOROLAC TROMETHAMINE 60 MG/2ML IM SOLN
60.0000 mg | Freq: Once | INTRAMUSCULAR | Status: AC
  Administered 2012-07-07: 60 mg via INTRAMUSCULAR
  Filled 2012-07-07: qty 2

## 2012-07-07 NOTE — ED Notes (Signed)
Pt reports cough and congestion for 1 week, cp at times--pain is worse when coughing, denies any sob. resp even and unlabored.  Also thinks may have re-injured her neck last week, lifting boxes.

## 2012-07-07 NOTE — ED Provider Notes (Signed)
History    This chart was scribed for Mallory Octave, MD, MD by Smitty Pluck, ED Scribe. The patient was seen in room APA19 and the patient's care was started at 8:23AM.   CSN: [id number]  Arrival date & time 07/07/12  5784      Chief Complaint  Patient presents with  . Cough  . Pleurisy  . Neck Pain    (Consider location/radiation/quality/duration/timing/severity/associated sxs/prior treatment) The history is provided by the patient. No language interpreter was used.   Mallory GRAMS is a 51 y.o. female who presents to the Emergency Department complaining of constant, moderate productive cough with yellow sputum onset 8 days ago. Pt reports that she has mild chest pain due to cough and SOB. Chest pain is aggravated with movement and breathing. Pt reports taking motrin and mucinex without relief. She reports that she reinjured her neck causing left side neck pain. Denies fever, chills, abdominal pain, diarrhea and any other pain . Pt reports vomiting 1x 8 days ago. Pt has hx of hypothyroidism and HTN. Pt smokes cigarettes.  Past Medical History  Diagnosis Date  . Hypertension   . Thyroid disease     Past Surgical History  Procedure Date  . Tubal ligation   . Nm thyroid stim supress     No family history on file.  History  Substance Use Topics  . Smoking status: Current Every Day Smoker    Types: Cigarettes  . Smokeless tobacco: Not on file  . Alcohol Use: Yes     Comment: occ    OB History    Grav Para Term Preterm Abortions TAB SAB Ect Mult Living                  Review of Systems  All other systems reviewed and are negative.  10 Systems reviewed and all are negative for acute change except as noted in the HPI.   Allergies  Review of patient's allergies indicates no known allergies.  Home Medications   Current Outpatient Rx  Name  Route  Sig  Dispense  Refill  . FLUTICASONE PROPIONATE 50 MCG/ACT NA SUSP   Nasal   Place 2 sprays into the nose daily  as needed. FOR CONGESTION         . GUAIFENESIN ER 600 MG PO TB12   Oral   Take 600 mg by mouth 2 (two) times daily as needed. FOR CONGESTION         . LEVOTHYROXINE SODIUM 150 MCG PO TABS   Oral   Take 150 mcg by mouth daily before breakfast.         . MEGESTROL ACETATE 40 MG PO TABS   Oral   Take 40 mg by mouth every evening.         Marland Kitchen METOPROLOL TARTRATE 25 MG PO TABS   Oral   Take 25 mg by mouth 2 (two) times daily.         . ACETAMINOPHEN 500 MG PO TABS   Oral   Take 1,000 mg by mouth every 6 (six) hours as needed. FOR PAIN         . DOXYCYCLINE HYCLATE 100 MG PO CAPS   Oral   Take 1 capsule (100 mg total) by mouth 2 (two) times daily.   20 capsule   0     BP 179/102  Pulse 77  Temp 97.4 F (36.3 C)  Resp 20  SpO2 100%  Physical Exam  Nursing note and vitals  reviewed. Constitutional: She is oriented to person, place, and time. She appears well-developed and well-nourished. No distress.  HENT:  Head: Normocephalic and atraumatic.  Neck: Normal range of motion. Neck supple.  Cardiovascular: Normal rate, regular rhythm and normal heart sounds.        +2 radial pulses  Pulmonary/Chest: Effort normal and breath sounds normal. No respiratory distress. She exhibits tenderness (mild ).  Musculoskeletal:       Left paraspinal muscle pain No midline pain Reduced rom of neck secondary to pain   Neurological: She is alert and oriented to person, place, and time.       Equal grip strength bilaterally   Skin: Skin is warm and dry.  Psychiatric: She has a normal mood and affect. Her behavior is normal.    ED Course  Procedures (including critical care time) DIAGNOSTIC STUDIES: Oxygen Saturation is 100% on room air, normal by my interpretation.    COORDINATION OF CARE: 8:30 AM Discussed ED treatment with pt  9:30 AM Ordered:     . [COMPLETED] albuterol  2 puff Inhalation Once  . [COMPLETED] ketorolac  60 mg Intramuscular Once     9:59AM  Recheck. Discussed imaging results with pt. Pt reports symptoms have improved.     Labs Reviewed  D-DIMER, QUANTITATIVE   Dg Chest 2 View  07/07/2012  *RADIOLOGY REPORT*  Clinical Data: Cough and congestion.  Pleurisy.  CHEST - 2 VIEW  Comparison: 01/04/2012  Findings: The lungs are clear without focal infiltrate, edema, pneumothorax or pleural effusion. Extreme lower part of costophrenic angles not included on the film bilaterally.  The cardiopericardial silhouette is within normal limits for size. Imaged bony structures of the thorax are intact.  IMPRESSION: Stable.  No acute cardiopulmonary process.   Original Report Authenticated By: Kennith Center, M.D.    Dg Cervical Spine Complete  07/07/2012  *RADIOLOGY REPORT*  Clinical Data: Neck pain and rt arm numbness.  CERVICAL SPINE - COMPLETE 4+ VIEW  Comparison: 09/21/2010  Findings: The T1 level is poorly seen on the lateral projection, but the cervicothoracic junction is seen well enough to ensure that there is no gross malalignment at C7-T1.  Spurring noted at C5-6 and C6-7, with uncinate spurring on the left at to C6-7 without overt osseous foraminal stenosis.  No fracture or malalignment identified.  No prevertebral soft tissue swelling.  IMPRESSION:  1.  Cervical spondylosis at C5-6 and C6-7.  No fracture or static instability observed.   Original Report Authenticated By: Gaylyn Rong, M.D.      1. Bronchitis       MDM  8 days of cough, congestion, chest pain with coughing that is worse with movement and generalized weakness. No distress, no hypoxia no increased work of breathing on exam. Lungs clear.  Chronic L sided neck pain. Grip strengths equal. No weakness, numbness, tingling.  CXR negative. Treat for bronchitis, smoking cessation, albuterol.    Date: 07/07/2012  Rate: 69  Rhythm: normal sinus rhythm  QRS Axis: normal  Intervals: normal  ST/T Wave abnormalities: normal  Conduction Disutrbances:none  Narrative  Interpretation:   Old EKG Reviewed: unchanged     I personally performed the services described in this documentation, which was scribed in my presence. The recorded information has been reviewed and is accurate.   Mallory Octave, MD 07/07/12 1009

## 2012-07-07 NOTE — ED Notes (Signed)
Pulse ox 100% on room air while ambulating.

## 2012-07-07 NOTE — ED Notes (Signed)
Pt c/o chest congestion, chest pain that is worse with movement, coughing, breathing, weakness, cough is productive with yellow sputum production, pt also c/o "reinjuring her neck", a week ago,  hx of previous injury a year ago, pt was pulling an object down, pain in right side of neck area,

## 2012-07-07 NOTE — Progress Notes (Signed)
Instructed patient on use of Albuterol MDI with spacer. She was able to demonstrate her understanding. 

## 2012-10-12 ENCOUNTER — Emergency Department (HOSPITAL_COMMUNITY)
Admission: EM | Admit: 2012-10-12 | Discharge: 2012-10-12 | Disposition: A | Discharge status: Home / Self Care | Payer: BC Managed Care – PPO | Attending: Emergency Medicine | Admitting: Emergency Medicine

## 2012-10-12 ENCOUNTER — Encounter (HOSPITAL_COMMUNITY): Payer: Self-pay

## 2012-10-12 DIAGNOSIS — R002 Palpitations: Secondary | ICD-10-CM | POA: Insufficient documentation

## 2012-10-12 DIAGNOSIS — Z79899 Other long term (current) drug therapy: Secondary | ICD-10-CM | POA: Insufficient documentation

## 2012-10-12 DIAGNOSIS — F17200 Nicotine dependence, unspecified, uncomplicated: Secondary | ICD-10-CM | POA: Insufficient documentation

## 2012-10-12 DIAGNOSIS — Z862 Personal history of diseases of the blood and blood-forming organs and certain disorders involving the immune mechanism: Secondary | ICD-10-CM | POA: Insufficient documentation

## 2012-10-12 DIAGNOSIS — J0100 Acute maxillary sinusitis, unspecified: Secondary | ICD-10-CM | POA: Insufficient documentation

## 2012-10-12 DIAGNOSIS — Z3202 Encounter for pregnancy test, result negative: Secondary | ICD-10-CM | POA: Insufficient documentation

## 2012-10-12 DIAGNOSIS — E039 Hypothyroidism, unspecified: Secondary | ICD-10-CM | POA: Insufficient documentation

## 2012-10-12 DIAGNOSIS — I10 Essential (primary) hypertension: Secondary | ICD-10-CM | POA: Insufficient documentation

## 2012-10-12 DIAGNOSIS — R51 Headache: Secondary | ICD-10-CM | POA: Insufficient documentation

## 2012-10-12 DIAGNOSIS — J3489 Other specified disorders of nose and nasal sinuses: Secondary | ICD-10-CM | POA: Insufficient documentation

## 2012-10-12 LAB — BASIC METABOLIC PANEL
BUN: 11 mg/dL (ref 6–23)
CO2: 22 mEq/L (ref 19–32)
Calcium: 10.3 mg/dL (ref 8.4–10.5)
Chloride: 105 mEq/L (ref 96–112)
Creatinine, Ser: 1.16 mg/dL — ABNORMAL HIGH (ref 0.50–1.10)
GFR calc Af Amer: 63 mL/min — ABNORMAL LOW (ref >90)
GFR calc non Af Amer: 54 mL/min — ABNORMAL LOW (ref >90)
Glucose, Bld: 89 mg/dL (ref 70–99)
Potassium: 4.4 mEq/L (ref 3.5–5.1)
Sodium: 139 mEq/L (ref 135–145)

## 2012-10-12 LAB — POCT PREGNANCY, URINE: Preg Test, Ur: NEGATIVE

## 2012-10-12 LAB — CBC WITH DIFFERENTIAL/PLATELET
Basophils Absolute: 0 10*3/uL (ref 0.0–0.1)
Basophils Relative: 0 % (ref 0–1)
Eosinophils Absolute: 0.1 10*3/uL (ref 0.0–0.7)
Eosinophils Relative: 1 % (ref 0–5)
HCT: 44.7 % (ref 36.0–46.0)
Hemoglobin: 14.9 g/dL (ref 12.0–15.0)
Lymphocytes Relative: 25 % (ref 12–46)
Lymphs Abs: 1.6 10*3/uL (ref 0.7–4.0)
MCH: 30.5 pg (ref 26.0–34.0)
MCHC: 33.3 g/dL (ref 30.0–36.0)
MCV: 91.6 fL (ref 78.0–100.0)
Monocytes Absolute: 0.5 10*3/uL (ref 0.1–1.0)
Monocytes Relative: 8 % (ref 3–12)
Neutro Abs: 4.2 10*3/uL (ref 1.7–7.7)
Neutrophils Relative %: 66 % (ref 43–77)
Platelets: 179 10*3/uL (ref 150–400)
RBC: 4.88 MIL/uL (ref 3.87–5.11)
RDW: 15.1 % (ref 11.5–15.5)
WBC: 6.4 10*3/uL (ref 4.0–10.5)

## 2012-10-12 LAB — URINALYSIS, ROUTINE W REFLEX MICROSCOPIC
Bilirubin Urine: NEGATIVE
Glucose, UA: NEGATIVE mg/dL
Ketones, ur: NEGATIVE mg/dL
Leukocytes, UA: NEGATIVE
Nitrite: NEGATIVE
Protein, ur: NEGATIVE mg/dL
Specific Gravity, Urine: 1.01 (ref 1.005–1.030)
Urobilinogen, UA: 0.2 mg/dL (ref 0.0–1.0)
pH: 6 (ref 5.0–8.0)

## 2012-10-12 LAB — URINE MICROSCOPIC-ADD ON

## 2012-10-12 LAB — TSH: TSH: 9.396 u[IU]/mL — ABNORMAL HIGH (ref 0.350–4.500)

## 2012-10-12 MED ORDER — AMOXICILLIN 500 MG PO CAPS: 500.0000 mg | ORAL_CAPSULE | Freq: Three times a day (TID) | ORAL | Status: DC

## 2012-10-12 NOTE — ED Notes (Signed)
Patient does not need anything at this time. 

## 2012-10-12 NOTE — ED Notes (Signed)
States that her heart was racing this morning, states that she has thyroid problems and hasn't had it checked in over a year.  States that she is having facial pain that she thinks is sinus problems.  States that she has been feeling exhausted 6 weeks or more and has been getting worse since then.

## 2012-10-12 NOTE — ED Provider Notes (Addendum)
History     CSN: [id number]  Arrival date & time 10/12/12  7829   First MD Initiated Contact with Patient 10/12/12 [phone]      Chief Complaint  Patient presents with  . Fatigue  . Facial Pain  . Palpitations    (Consider location/radiation/quality/duration/timing/severity/associated sxs/prior treatment) HPI Comments: Mallory Smith is a 52 y.o. Female presenting with multiple complaints which she reports feels like her thyroid is "acting up".  She describes generalized fatigue for the past few months along with intermittent episodes of palpitations without chest pain or shortness of breath,  Although she has noticed a decreased exercise tolerance  since starting to smoke cigarettes again.  Additionally,  She has right facial pain and pressure along with nasal congestion with clear nasal drainage.  She denies nose bleeds,  ear pain,  Dizziness or tinnitus and has not felt feverish.  She is taking an otc decongestant since yesterday for her sinus pain.   Her past medical history is significant for hypertension for which she takes lopressor,  Her last dose taken this am,  Treated Graves disease now on levothyroxine.  Her last TSH was done over 1 year ago.  She is currently searching for a new pcp, since her pcp moved.  Past medical history is also significant for anemia secondary to menorrhagia.  She is taking megace for control of this.       The history is provided by the patient.    Past Medical History  Diagnosis Date  . Hypertension   . Thyroid disease     Past Surgical History  Procedure Laterality Date  . Tubal ligation    . Nm thyroid stim supress      No family history on file.  History  Substance Use Topics  . Smoking status: Current Every Day Smoker    Types: Cigarettes  . Smokeless tobacco: Not on file  . Alcohol Use: Yes     Comment: occ    OB History   Grav Para Term Preterm Abortions TAB SAB Ect Mult Living                  Review of Systems   Constitutional: Positive for fatigue. Negative for fever and chills.  HENT: Positive for congestion, rhinorrhea and sinus pressure. Negative for nosebleeds, sore throat, facial swelling, neck pain and tinnitus.   Eyes: Negative.   Respiratory: Negative for cough, chest tightness, shortness of breath and wheezing.   Cardiovascular: Positive for palpitations. Negative for chest pain.  Gastrointestinal: Negative for nausea and abdominal pain.  Genitourinary: Negative.   Musculoskeletal: Negative for joint swelling and arthralgias.  Skin: Negative.  Negative for rash and wound.  Neurological: Negative for dizziness, weakness, light-headedness, numbness and headaches.  Psychiatric/Behavioral: Negative.     Allergies  Review of patient's allergies indicates no known allergies.  Home Medications   Current Outpatient Rx  Name  Route  Sig  Dispense  Refill  . acetaminophen (TYLENOL) 500 MG tablet   Oral   Take 1,000 mg by mouth every 6 (six) hours as needed for pain.          . Chlorphen-Phenyleph-APAP 2-5-325 MG TABS   Oral   Take 1 tablet by mouth every 4 (four) hours as needed (Congestion, Allergies).         . Cyanocobalamin (VITAMIN B 12 PO)   Oral   Take 1 tablet by mouth daily.         . fluticasone (FLONASE) 50  MCG/ACT nasal spray   Nasal   Place 2 sprays into the nose daily as needed for allergies. FOR CONGESTION         . guaiFENesin (MUCINEX) 600 MG 12 hr tablet   Oral   Take 600 mg by mouth 2 (two) times daily as needed. FOR CONGESTION         . ibuprofen (ADVIL,MOTRIN) 200 MG tablet   Oral   Take 200 mg by mouth every 6 (six) hours as needed for pain.         . IRON PO   Oral   Take 1 tablet by mouth daily.         Marland Kitchen levothyroxine (SYNTHROID, LEVOTHROID) 150 MCG tablet   Oral   Take 150 mcg by mouth daily before breakfast.         . megestrol (MEGACE) 40 MG tablet   Oral   Take 40 mg by mouth every evening.         . metoprolol tartrate  (LOPRESSOR) 25 MG tablet   Oral   Take 25 mg by mouth 2 (two) times daily.         Marland Kitchen amoxicillin (AMOXIL) 500 MG capsule   Oral   Take 1 capsule (500 mg total) by mouth 3 (three) times daily.   30 capsule   0     BP 176/112  Pulse 60  Temp(Src) 97.9 F (36.6 C) (Oral)  Resp 17  SpO2 99%  Physical Exam  Nursing note and vitals reviewed. Constitutional: She appears well-developed and well-nourished.  HENT:  Head: Normocephalic and atraumatic.  Nose: Mucosal edema and rhinorrhea present. Right sinus exhibits maxillary sinus tenderness.  Eyes: Conjunctivae are normal.  Neck: Normal range of motion. Neck supple.  Cardiovascular: Normal rate, regular rhythm, normal heart sounds and intact distal pulses.   Pulmonary/Chest: Effort normal and breath sounds normal. No respiratory distress. She has no wheezes. She has no rales.  Abdominal: Soft. She exhibits no distension.  Musculoskeletal: Normal range of motion. She exhibits no edema.  Neurological: She is alert.  Skin: Skin is warm and dry.  Psychiatric: She has a normal mood and affect.    ED Course  Procedures (including critical care time)    Date: 10/12/2012  Rate: 69  Rhythm: normal sinus rhythm  QRS Axis: normal  Intervals: normal  ST/T Wave abnormalities: normal  Conduction Disutrbances:none  Narrative Interpretation:   Old EKG Reviewed: unchanged    Labs Reviewed  BASIC METABOLIC PANEL - Abnormal; Notable for the following:    Creatinine, Ser 1.16 (*)    GFR calc non Af Amer 54 (*)    GFR calc Af Amer 63 (*)    All other components within normal limits  URINALYSIS, ROUTINE W REFLEX MICROSCOPIC - Abnormal; Notable for the following:    Hgb urine dipstick MODERATE (*)    All other components within normal limits  CBC WITH DIFFERENTIAL  URINE MICROSCOPIC-ADD ON  TSH  POCT PREGNANCY, URINE   No results found.   1. Sinusitis, acute, maxillary       MDM  HTN with recent decongestant use,  palpitations,   Generalized fatigue with known history of hypothyroidism and anemia.  TSH drawn and pending.  Pt encouraged to dc decongestant,  Use coricidin instead.  Labs ruled out anemia,  ekg normal.  No tachycardia while in ed.  Referrals given for obtaining pcp.  Prescribed amoxil for sinusitis.    Patients labs and/or radiological studies were reviewed  during the medical decision making and disposition process.           Burgess Amor, PA 10/12/12 0939  Burgess Amor, PA 10/12/12 [phone]

## 2012-10-12 NOTE — ED Provider Notes (Signed)
Medical screening examination/treatment/procedure(s) were performed by non-physician practitioner and as supervising physician I was immediately available for consultation/collaboration.   Kapono Luhn L Teriyah Purington, MD 10/12/12 1535 

## 2012-10-12 NOTE — ED Notes (Signed)
Pt reports fatigue for the past few months.  Pt reports that she feels exhausted, sinus pressure in her face, and feels like her heart is racing.

## 2012-10-13 NOTE — ED Provider Notes (Signed)
Medical screening examination/treatment/procedure(s) were performed by non-physician practitioner and as supervising physician I was immediately available for consultation/collaboration.   Kalayah Leske L Kahleah Crass, MD 10/13/12 0856 

## 2013-05-11 ENCOUNTER — Other Ambulatory Visit: Payer: Self-pay | Admitting: Obstetrics & Gynecology

## 2014-10-24 ENCOUNTER — Emergency Department (HOSPITAL_COMMUNITY)
Admission: EM | Admit: 2014-10-24 | Discharge: 2014-10-24 | Disposition: A | Discharge status: Home / Self Care | Payer: Worker's Compensation | Attending: Emergency Medicine | Admitting: Emergency Medicine

## 2014-10-24 ENCOUNTER — Emergency Department (HOSPITAL_COMMUNITY): Payer: Worker's Compensation

## 2014-10-24 ENCOUNTER — Encounter (HOSPITAL_COMMUNITY): Payer: Self-pay | Admitting: Emergency Medicine

## 2014-10-24 DIAGNOSIS — Z792 Long term (current) use of antibiotics: Secondary | ICD-10-CM | POA: Insufficient documentation

## 2014-10-24 DIAGNOSIS — Y9389 Activity, other specified: Secondary | ICD-10-CM | POA: Insufficient documentation

## 2014-10-24 DIAGNOSIS — Y9289 Other specified places as the place of occurrence of the external cause: Secondary | ICD-10-CM | POA: Diagnosis not present

## 2014-10-24 DIAGNOSIS — I10 Essential (primary) hypertension: Secondary | ICD-10-CM | POA: Insufficient documentation

## 2014-10-24 DIAGNOSIS — S86811A Strain of other muscle(s) and tendon(s) at lower leg level, right leg, initial encounter: Secondary | ICD-10-CM | POA: Diagnosis not present

## 2014-10-24 DIAGNOSIS — E079 Disorder of thyroid, unspecified: Secondary | ICD-10-CM | POA: Diagnosis not present

## 2014-10-24 DIAGNOSIS — S79911A Unspecified injury of right hip, initial encounter: Secondary | ICD-10-CM | POA: Insufficient documentation

## 2014-10-24 DIAGNOSIS — S8991XA Unspecified injury of right lower leg, initial encounter: Secondary | ICD-10-CM | POA: Diagnosis present

## 2014-10-24 DIAGNOSIS — Y990 Civilian activity done for income or pay: Secondary | ICD-10-CM | POA: Diagnosis not present

## 2014-10-24 DIAGNOSIS — W01198A Fall on same level from slipping, tripping and stumbling with subsequent striking against other object, initial encounter: Secondary | ICD-10-CM | POA: Insufficient documentation

## 2014-10-24 DIAGNOSIS — Z79899 Other long term (current) drug therapy: Secondary | ICD-10-CM | POA: Diagnosis not present

## 2014-10-24 DIAGNOSIS — Z720 Tobacco use: Secondary | ICD-10-CM | POA: Insufficient documentation

## 2014-10-24 DIAGNOSIS — S86911A Strain of unspecified muscle(s) and tendon(s) at lower leg level, right leg, initial encounter: Secondary | ICD-10-CM

## 2014-10-24 DIAGNOSIS — M25551 Pain in right hip: Secondary | ICD-10-CM

## 2014-10-24 MED ORDER — KETOROLAC TROMETHAMINE 10 MG PO TABS
10.0000 mg | ORAL_TABLET | Freq: Once | ORAL | Status: AC
  Administered 2014-10-24: 10 mg via ORAL
  Filled 2014-10-24: qty 1

## 2014-10-24 MED ORDER — HYDROCODONE-ACETAMINOPHEN 5-325 MG PO TABS: 1.0000 | ORAL_TABLET | ORAL | Status: DC | PRN

## 2014-10-24 MED ORDER — CELECOXIB 100 MG PO CAPS: 100.0000 mg | ORAL_CAPSULE | Freq: Two times a day (BID) | ORAL | Status: DC

## 2014-10-24 MED ORDER — ACETAMINOPHEN 325 MG PO TABS
650.0000 mg | ORAL_TABLET | Freq: Once | ORAL | Status: AC
  Administered 2014-10-24: 650 mg via ORAL
  Filled 2014-10-24: qty 2

## 2014-10-24 NOTE — ED Provider Notes (Signed)
CSN: [id number]     Arrival date & time 10/24/14  0801 History   First MD Initiated Contact with Patient 10/24/14 [phone]     Chief Complaint  Patient presents with  . Leg Pain     (Consider location/radiation/quality/duration/timing/severity/associated sxs/prior Treatment) Patient is a 54 y.o. female presenting with leg pain. The history is provided by the patient.  Leg Pain Location:  Leg and knee Time since incident:  3 days Injury: yes   Mechanism of injury comment:  Patient slipped on a limb when at work, fell forward, hit her knee on a steel rack, and had her right leg to be been in an arc with physician. Leg location:  R leg Knee location:  R knee Pain details:    Quality:  Aching and burning   Severity:  Moderate   Onset quality:  Sudden   Duration:  3 days   Timing:  Intermittent   Progression:  Worsening Chronicity:  New Dislocation: no   Prior injury to area:  No Relieved by:  Nothing Worsened by:  Bearing weight Ineffective treatments:  Acetaminophen Associated symptoms: stiffness   Associated symptoms: no back pain, no neck pain, no numbness and no tingling   Risk factors: no frequent fractures and no known bone disorder     Past Medical History  Diagnosis Date  . Hypertension   . Thyroid disease    Past Surgical History  Procedure Laterality Date  . Tubal ligation    . Nm thyroid stim supress     Family History  Problem Relation Age of Onset  . Diabetes Other   . Stroke Other    History  Substance Use Topics  . Smoking status: Current Every Day Smoker -- 0.03 packs/day for 20 years    Types: Cigarettes  . Smokeless tobacco: Never Used  . Alcohol Use: Yes     Comment: occ   OB History    Gravida Para Term Preterm AB TAB SAB Ectopic Multiple Living   Review of Systems  Constitutional: Negative for activity change.       All ROS Neg except as noted in HPI  HENT: Negative for nosebleeds.   Eyes: Negative for photophobia and  discharge.  Respiratory: Negative for cough, shortness of breath and wheezing.   Cardiovascular: Negative for chest pain and palpitations.  Gastrointestinal: Negative for abdominal pain and blood in stool.  Genitourinary: Negative for dysuria, frequency and hematuria.  Musculoskeletal: Positive for stiffness. Negative for back pain, arthralgias and neck pain.  Skin: Negative.   Neurological: Negative for dizziness, seizures and speech difficulty.  Psychiatric/Behavioral: Negative for hallucinations and confusion.      Allergies  Review of patient's allergies indicates no known allergies.  Home Medications   Prior to Admission medications   Medication Sig Start Date End Date Taking? Authorizing Provider  acetaminophen (TYLENOL) 500 MG tablet Take 1,000 mg by mouth every 6 (six) hours as needed for pain.    Yes Historical Provider, MD  ALPRAZolam (XANAX) 0.25 MG tablet Take 0.25 mg by mouth daily. 09/21/14  Yes Historical Provider, MD  amLODipine (NORVASC) 5 MG tablet Take 5 mg by mouth daily. 10/15/14  Yes Historical Provider, MD  fluticasone (FLONASE) 50 MCG/ACT nasal spray Place 2 sprays into the nose daily as needed for allergies. FOR CONGESTION   Yes Historical Provider, MD  guaiFENesin (MUCINEX) 600 MG 12 hr tablet Take 600 mg by mouth  2 (two) times daily as needed. FOR CONGESTION   Yes Historical Provider, MD  hydrochlorothiazide (MICROZIDE) 12.5 MG capsule Take 1 capsule by mouth daily. 09/16/14  Yes Historical Provider, MD  ibuprofen (ADVIL,MOTRIN) 200 MG tablet Take 200 mg by mouth every 6 (six) hours as needed for pain.   Yes Historical Provider, MD  levothyroxine (SYNTHROID, LEVOTHROID) 175 MCG tablet Take 175 mcg by mouth daily. 10/14/14  Yes Historical Provider, MD  metoprolol tartrate (LOPRESSOR) 25 MG tablet Take 1 tablet by mouth 2 (two) times daily. 10/14/14  Yes Historical Provider, MD  Multiple Vitamin (MULTIVITAMIN WITH MINERALS) TABS tablet Take 1 tablet by mouth daily.   Yes  Historical Provider, MD  amoxicillin (AMOXIL) 500 MG capsule Take 1 capsule (500 mg total) by mouth 3 (three) times daily. Patient not taking: Reported on 10/24/2014 10/12/12   Burgess Amor, PA-C  megestrol (MEGACE) 40 MG tablet TAKE ONE TABLET BY MOUTH EVERY DAY Patient not taking: Reported on 10/24/2014 05/11/13   Lazaro Arms, MD   BP 143/76 mmHg  Pulse 80  Temp(Src) 97.9 F (36.6 C) (Oral)  Resp 18  Ht  (1.753 m)  Wt 190 lb (86.183 kg)  BMI 28.05 kg/m2  SpO2 97% Physical Exam  Constitutional: She is oriented to person, place, and time. She appears well-developed and well-nourished.  Non-toxic appearance.  HENT:  Head: Normocephalic.  Right Ear: Tympanic membrane and external ear normal.  Left Ear: Tympanic membrane and external ear normal.  Eyes: EOM and lids are normal. Pupils are equal, round, and reactive to light.  Neck: Normal range of motion. Neck supple. Carotid bruit is not present.  Cardiovascular: Normal rate, regular rhythm, normal heart sounds, intact distal pulses and normal pulses.   Pulmonary/Chest: Breath sounds normal. No respiratory distress.  Abdominal: Soft. Bowel sounds are normal. There is no tenderness. There is no guarding.  Musculoskeletal: Normal range of motion. She exhibits tenderness.  There is full range of motion of the right toes and ankle. There is no deformity of the right tibia or fibula area. There is pain with flexion and extension of the right knee, there is lateral greater than medial pain to palpation of the right knee. There is no deformity of the quadricep area. There is no mass of the posterior knee. There is pain to palpate right hip. No palpable deformity, no hematoma appreciated. Full range of motion of the left lower extremity without problem.  Soreness of the right arm, no deformity appreciated.  Lymphadenopathy:       Head (right side): No submandibular adenopathy present.       Head (left side): No submandibular adenopathy present.     She has no cervical adenopathy.  Neurological: She is alert and oriented to person, place, and time. She has normal strength. No cranial nerve deficit or sensory deficit.  Skin: Skin is warm and dry.  Psychiatric: She has a normal mood and affect. Her speech is normal.  Nursing note and vitals reviewed.   ED Course  Procedures (including critical care time) Labs Review Labs Reviewed - No data to display  Imaging Review Dg Knee Complete 4 Views Right  10/24/2014   CLINICAL DATA:  Leg pain, slipped at work Monday, fall  EXAM: RIGHT KNEE - COMPLETE 4+ VIEW  COMPARISON:  None.  FINDINGS: Four views of the right knee submitted. No acute fracture or subluxation. 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 No joint effusion.  IMPRESSION: No  acute fracture or subluxation. 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   On: 10/24/2014 11:06   Dg Hip Unilat With Pelvis 2-3 Views Right  10/24/2014   CLINICAL DATA:  Slip and fall with right leg pain, initial encounter  EXAM: RIGHT HIP (WITH PELVIS) 2-3 VIEWS  COMPARISON:  None.  FINDINGS: Mild osteophytic changes are noted in the superior aspect of the acetabulum. No acute fracture or dislocation is noted. Pelvic ring is intact. No soft tissue abnormality is noted.  IMPRESSION: Mild degenerative changes without acute abnormality.   Electronically Signed   By: Alcide CleverMark  Lukens M.D.   On: 10/24/2014 11:03     EKG Interpretation None      MDM  Vital signs are within normal limits. Pulse oximetry is 97% on room air. The x-ray of the right knee shows no acute fracture or subluxation, there is questionable prior avascular necrosis noted of the distal shaft of the right femur. X-ray of the pelvis and hip shows mild degenerative changes, but no acute fracture or dislocation.  The patient will be treated with Celebrex 2 times daily, and Norco every 4 hours  if needed for pain. Patient will follow-up with Dr. Hilda LiasKeeling, orthopedics if not improving.    Final diagnoses:  Hip pain, acute, right    **I have reviewed nursing notes, vital signs, and all appropriate lab and imaging results for this patient.Mallory Smith*    Annikah Lovins, PA-C 10/24/14 1136  Bethann BerkshireJoseph Zammit, MD 10/24/14 [phone]

## 2014-10-24 NOTE — ED Notes (Signed)
Patient c/o right leg and knee pain. Per patient slipped on lemon at work Monday night which caused her to fall forward hitting her knee on steel rack holding wine and "body went forward while right leg stayed behind." Patient has notable limp. Denies hitting head/LOC. Patient is here under workers comp.

## 2014-10-24 NOTE — Discharge Instructions (Signed)
Knee Sprain A knee sprain is a tear in one of the strong, fibrous tissues that connect the bones (ligaments) in your knee. The severity of the sprain depends on how much of the ligament is torn. The tear can be either partial or complete. CAUSES  Often, sprains are a result of a fall or injury. The force of the impact causes the fibers of your ligament to stretch too much. This excess tension causes the fibers of your ligament to tear. SIGNS AND SYMPTOMS  You may have some loss of motion in your knee. Other symptoms include:  Bruising.  Pain in the knee area.  Tenderness of the knee to the touch.  Swelling. DIAGNOSIS  To diagnose a knee sprain, your health care provider will physically examine your knee. Your health care provider may also suggest an X-ray exam of your knee to make sure no bones are broken. TREATMENT  If your ligament is only partially torn, treatment usually involves keeping the knee in a fixed position (immobilization) or bracing your knee for activities that require movement for several weeks. To do this, your health care provider will apply a bandage, cast, or splint to keep your knee from moving and to support your knee during movement until it heals. For a partially torn ligament, the healing process usually takes 4-6 weeks. If your ligament is completely torn, depending on which ligament it is, you may need surgery to reconnect the ligament to the bone or reconstruct it. After surgery, a cast or splint may be applied and will need to stay on your knee for 4-6 weeks while your ligament heals. HOME CARE INSTRUCTIONS  Keep your injured knee elevated to decrease swelling.  To ease pain and swelling, apply ice to the injured area:  Put ice in a plastic bag.  Place a towel between your skin and the bag.  Leave the ice on for 20 minutes, 2-3 times a day.  Only take medicine for pain as directed by your health care provider.  Do not leave your knee unprotected until  pain and stiffness go away (usually 4-6 weeks).  If you have a cast or splint, do not allow it to get wet. If you have been instructed not to remove it, cover it with a plastic bag when you shower or bathe. Do not swim.  Your health care provider may suggest exercises for you to do during your recovery to prevent or limit permanent weakness and stiffness. SEEK IMMEDIATE MEDICAL CARE IF:  Your cast or splint becomes damaged.  Your pain becomes worse.  You have significant pain, swelling, or numbness below the cast or splint. MAKE SURE YOU:  Understand these instructions.  Will watch your condition.  Will get help right away if you are not doing well or get worse. Document Released: 08/02/2005 Document Revised: 05/23/2013 Document Reviewed: 03/14/2013 [facility] Patient Information 2015 [place], [place]. This information is not intended to replace advice given to you by your health care provider. Make sure you discuss any questions you have with your health care provider.  Hip Pain Your hip is the joint between your upper legs and your lower pelvis. The bones, cartilage, tendons, and muscles of your hip joint perform a lot of work each day supporting your body weight and allowing you to move around. Hip pain can range from a minor ache to severe pain in one or both of your hips. Pain may be felt on the inside of the hip joint near the groin, or the  outside near the buttocks and upper thigh. You may have swelling or stiffness as well.  HOME CARE INSTRUCTIONS   Take medicines only as directed by your health care provider.  Apply ice to the injured area:  Put ice in a plastic bag.  Place a towel between your skin and the bag.  Leave the ice on for 15-20 minutes at a time, 3-4 times a day.  Keep your leg raised (elevated) when possible to lessen swelling.  Avoid activities that cause pain.  Follow specific exercises as directed by your health care provider.  Sleep with a pillow  between your legs on your most comfortable side.  Record how often you have hip pain, the location of the pain, and what it feels like. SEEK MEDICAL CARE IF:   You are unable to put weight on your leg.  Your hip is red or swollen or very tender to touch.  Your pain or swelling continues or worsens after 1 week.  You have increasing difficulty walking.  You have a fever. SEEK IMMEDIATE MEDICAL CARE IF:   You have fallen.  You have a sudden increase in pain and swelling in your hip. MAKE SURE YOU:   Understand these instructions.  Will watch your condition.  Will get help right away if you are not doing well or get worse. Document Released: 01/20/2010 Document Revised: 12/17/2013 Document Reviewed: 03/29/2013 [facility] Patient Information 2015 [place], [place]. This information is not intended to replace advice given to you by your health care provider. Make sure you discuss any questions you have with your health care provider.

## 2015-05-04 ENCOUNTER — Encounter (HOSPITAL_COMMUNITY): Payer: Self-pay | Admitting: Emergency Medicine

## 2015-05-04 ENCOUNTER — Emergency Department (HOSPITAL_COMMUNITY)
Admission: EM | Admit: 2015-05-04 | Discharge: 2015-05-04 | Disposition: A | Discharge status: Home / Self Care | Payer: 59 | Attending: Emergency Medicine | Admitting: Emergency Medicine

## 2015-05-04 ENCOUNTER — Emergency Department (HOSPITAL_COMMUNITY): Payer: 59

## 2015-05-04 DIAGNOSIS — N938 Other specified abnormal uterine and vaginal bleeding: Secondary | ICD-10-CM | POA: Diagnosis not present

## 2015-05-04 DIAGNOSIS — I10 Essential (primary) hypertension: Secondary | ICD-10-CM | POA: Insufficient documentation

## 2015-05-04 DIAGNOSIS — E079 Disorder of thyroid, unspecified: Secondary | ICD-10-CM | POA: Diagnosis not present

## 2015-05-04 DIAGNOSIS — Z720 Tobacco use: Secondary | ICD-10-CM | POA: Diagnosis not present

## 2015-05-04 DIAGNOSIS — Z79899 Other long term (current) drug therapy: Secondary | ICD-10-CM | POA: Diagnosis not present

## 2015-05-04 DIAGNOSIS — N939 Abnormal uterine and vaginal bleeding, unspecified: Secondary | ICD-10-CM | POA: Diagnosis present

## 2015-05-04 LAB — CBC WITH DIFFERENTIAL/PLATELET
Basophils Absolute: 0 10*3/uL (ref 0.0–0.1)
Basophils Relative: 1 %
Eosinophils Absolute: 0.1 10*3/uL (ref 0.0–0.7)
Eosinophils Relative: 2 %
HCT: 27.9 % — ABNORMAL LOW (ref 36.0–46.0)
Hemoglobin: 8.2 g/dL — ABNORMAL LOW (ref 12.0–15.0)
Lymphocytes Relative: 31 %
Lymphs Abs: 1.2 10*3/uL (ref 0.7–4.0)
MCH: 24.3 pg — ABNORMAL LOW (ref 26.0–34.0)
MCHC: 29.4 g/dL — ABNORMAL LOW (ref 30.0–36.0)
MCV: 82.5 fL (ref 78.0–100.0)
Monocytes Absolute: 0.3 10*3/uL (ref 0.1–1.0)
Monocytes Relative: 7 %
Neutro Abs: 2.3 10*3/uL (ref 1.7–7.7)
Neutrophils Relative %: 59 %
Platelets: 118 10*3/uL — ABNORMAL LOW (ref 150–400)
RBC: 3.38 MIL/uL — ABNORMAL LOW (ref 3.87–5.11)
RDW: 20.1 % — ABNORMAL HIGH (ref 11.5–15.5)
WBC: 3.9 10*3/uL — ABNORMAL LOW (ref 4.0–10.5)

## 2015-05-04 LAB — BASIC METABOLIC PANEL
Anion gap: 6 (ref 5–15)
BUN: 12 mg/dL (ref 6–20)
CO2: 20 mmol/L — ABNORMAL LOW (ref 22–32)
Calcium: 7.8 mg/dL — ABNORMAL LOW (ref 8.9–10.3)
Chloride: 114 mmol/L — ABNORMAL HIGH (ref 101–111)
Creatinine, Ser: 0.91 mg/dL (ref 0.44–1.00)
GFR calc Af Amer: >60 mL/min (ref >60)
GFR calc non Af Amer: >60 mL/min (ref >60)
Glucose, Bld: 91 mg/dL (ref 65–99)
Potassium: 3.7 mmol/L (ref 3.5–5.1)
Sodium: 140 mmol/L (ref 135–145)

## 2015-05-04 MED ORDER — HYDROCODONE-ACETAMINOPHEN 5-325 MG PO TABS
2.0000 | ORAL_TABLET | Freq: Once | ORAL | Status: AC
  Administered 2015-05-04: 2 via ORAL
  Filled 2015-05-04: qty 2

## 2015-05-04 MED ORDER — MEGESTROL ACETATE 40 MG PO TABS
40.0000 mg | ORAL_TABLET | Freq: Every day | ORAL | Status: DC
  Administered 2015-05-04: 40 mg via ORAL
  Filled 2015-05-04 (×2): qty 1

## 2015-05-04 MED ORDER — MEGESTROL ACETATE 40 MG PO TABS: 40.0000 mg | ORAL_TABLET | Freq: Every day | ORAL | Status: DC

## 2015-05-04 MED ORDER — MEGESTROL ACETATE 40 MG PO TABS
ORAL_TABLET | ORAL | Status: AC
Start: 2015-05-04 — End: 2015-05-04
  Filled 2015-05-04: qty 1

## 2015-05-04 MED ORDER — ONDANSETRON 8 MG PO TBDP
8.0000 mg | ORAL_TABLET | Freq: Once | ORAL | Status: AC
  Administered 2015-05-04: 8 mg via ORAL
  Filled 2015-05-04: qty 1

## 2015-05-04 NOTE — Discharge Instructions (Signed)
Increase your Megestrol 40 mg to 3 tablets daily until the bleeding settles down, then take 2 tablets daily. Call your gynecologist tomorrow morning for an appointment early this week.

## 2015-05-04 NOTE — ED Provider Notes (Signed)
CSN: [id number]     Arrival date & time 05/04/15  1021 History  This chart was scribed for Mallory Hutching, MD by Ronney Lion, ED Scribe. This patient was seen in room APA09/APA09 and the patient's care was started at 11:33 AM.    Chief Complaint  Patient presents with  . Vaginal Bleeding   The history is provided by the patient. No language interpreter was used.    HPI Comments: Mallory Smith is a 54 y.o. female who is postmenopausal, presents to the Emergency Department complaining of constant vaginal bleeding that began 4 days ago and worsened 2 days ago. Patient states she has been wearing 3-4 pads at a time, and she has been soaking through them quickly. Patient has been on Megace. She denies seeing an OB/GYN regularly. Patient states her blood pressure is somewhat low today at 121/78, as she usually runs around 145/95. Patient has been taking Megace 40 mg daily since wintertime. No chest pain, dizziness, syncope PCP: Dr. Margo Aye  Past Medical History  Diagnosis Date  . Hypertension   . Thyroid disease    Past Surgical History  Procedure Laterality Date  . Tubal ligation    . Nm thyroid stim supress     Family History  Problem Relation Age of Onset  . Diabetes Other   . Stroke Other    Social History  Substance Use Topics  . Smoking status: Current Every Day Smoker -- 0.03 packs/day for 20 years    Types: Cigarettes  . Smokeless tobacco: Never Used  . Alcohol Use: Yes     Comment: occ   OB History    Gravida Para Term Preterm AB TAB SAB Ectopic Multiple Living   Review of Systems  Genitourinary: Positive for vaginal bleeding.  All other systems reviewed and are negative.  Allergies  Review of patient's allergies indicates no known allergies.  Home Medications   Prior to Admission medications   Medication Sig Start Date End Date Taking? Authorizing Provider  ALPRAZolam (XANAX) 0.25 MG tablet Take 0.25 mg by mouth daily. 09/21/14  Yes Historical Provider,  MD  amLODipine (NORVASC) 5 MG tablet Take 5 mg by mouth daily. 10/15/14  Yes Historical Provider, MD  celecoxib (CELEBREX) 100 MG capsule Take 1 capsule (100 mg total) by mouth 2 (two) times daily. 10/24/14  Yes Ivery Quale, PA-C  ferrous sulfate 325 (65 FE) MG tablet Take 325 mg by mouth daily. 02/18/15  Yes Historical Provider, MD  levothyroxine (SYNTHROID, LEVOTHROID) 175 MCG tablet Take 175 mcg by mouth daily. 10/14/14  Yes Historical Provider, MD  lisinopril-hydrochlorothiazide (PRINZIDE,ZESTORETIC) 10-12.5 MG per tablet Take 1 tablet by mouth daily. 03/17/15  Yes Historical Provider, MD  metoprolol tartrate (LOPRESSOR) 25 MG tablet Take 1 tablet by mouth 2 (two) times daily. 10/14/14  Yes Historical Provider, MD  Multiple Vitamin (MULTIVITAMIN WITH MINERALS) TABS tablet Take 1 tablet by mouth daily.   Yes Historical Provider, MD  acetaminophen (TYLENOL) 500 MG tablet Take 1,000 mg by mouth every 6 (six) hours as needed for pain.     Historical Provider, MD  amoxicillin (AMOXIL) 500 MG capsule Take 1 capsule (500 mg total) by mouth 3 (three) times daily. Patient not taking: Reported on 10/24/2014 10/12/12   Burgess Amor, PA-C  fluticasone [facility]) 50 MCG/ACT nasal spray Place 2 sprays into the nose daily as needed for allergies. FOR CONGESTION    Historical Provider, MD  guaiFENesin (MUCINEX) 600 MG 12 hr tablet Take 600 mg by mouth 2 (two) times daily as needed. FOR CONGESTION    Historical Provider, MD  HYDROcodone-acetaminophen (NORCO/VICODIN) 5-325 MG per tablet Take 1 tablet by mouth every 4 (four) hours as needed. Patient taking differently: Take 1 tablet by mouth every 4 (four) hours as needed for moderate pain.  10/24/14   Ivery Quale, PA-C  ibuprofen (ADVIL,MOTRIN) 200 MG tablet Take 200 mg by mouth every 6 (six) hours as needed for pain.    Historical Provider, MD  megestrol (MEGACE) 40 MG tablet Take 1 tablet (40 mg total) by mouth daily. 3 tablets daily until bleeding slows down, then 2  tablets daily 05/04/15   Mallory Hutching, MD   BP 118/74 mmHg  Pulse 77  Temp(Src) 97.4 F (36.3 C) (Oral)  Resp 13  Ht  (1.753 m)  Wt 215 lb (97.523 kg)  BMI 31.74 kg/m2  SpO2 100% Physical Exam  Constitutional: She is oriented to person, place, and time. She appears well-developed and well-nourished.  HENT:  Head: Normocephalic and atraumatic.  Eyes: Conjunctivae and EOM are normal. Pupils are equal, round, and reactive to light.  Neck: Normal range of motion. Neck supple.  Cardiovascular: Normal rate and regular rhythm.   Pulmonary/Chest: Effort normal and breath sounds normal.  Abdominal: Soft. Bowel sounds are normal. There is tenderness.  Lower abdominal midline tenderness.  Musculoskeletal: Normal range of motion.  Neurological: She is alert and oriented to person, place, and time.  Skin: Skin is warm and dry.  Psychiatric: She has a normal mood and affect. Her behavior is normal.  Nursing note and vitals reviewed.   ED Course  Procedures (including critical care time)  DIAGNOSTIC STUDIES: Oxygen Saturation is 100% on RA, normal by my interpretation.    COORDINATION OF CARE: 11:35 AM - Discussed treatment plan with pt at bedside which includes pelvic exam and U/S. Pt verbalized understanding and agreed to plan.    Labs Review Labs Reviewed  CBC WITH DIFFERENTIAL/PLATELET - Abnormal; Notable for the following:    WBC 3.9 (*)    RBC 3.38 (*)    Hemoglobin 8.2 (*)    HCT 27.9 (*)    MCH 24.3 (*)    MCHC 29.4 (*)    RDW 20.1 (*)    Platelets 118 (*)    All other components within normal limits  BASIC METABOLIC PANEL - Abnormal; Notable for the following:    Chloride 114 (*)    CO2 20 (*)    Calcium 7.8 (*)    All other components within normal limits    Imaging Review US Transvaginal Non-ob  05/04/2015   CLINICAL DATA:  Pelvic pain with vaginal bleeding for 4 days. History of tubal ligation. Initial encounter.  EXAM: TRANSABDOMINAL AND TRANSVAGINAL  ULTRASOUND OF PELVIS  TECHNIQUE: Both transabdominal and transvaginal ultrasound examinations of the pelvis were performed. Transabdominal technique was performed for global imaging of the pelvis including uterus, ovaries, adnexal regions, and pelvic cul-de-sac. It was necessary to proceed with endovaginal exam following the transabdominal exam to visualize the ovaries to better advantage.  COMPARISON:  CT 12/11/2006.  Pelvic ultrasound 09/01/2005.  FINDINGS: Uterus  Measurements: 14.6 x 6.7 x 9.5 cm. The uterus is heterogeneous in echotexture with at least 1 probable fibroid in the left fundal region measuring up to 6.3 cm.  Endometrium  Thickness: There is heterogeneous thickening of the endometrium to 20 mm. No focal lesion apparent.  Right ovary  Measurements: Not visualized secondary to prominent bowel gas.  Left ovary  Measurements: Not visualized secondary to prominent bowel gas.  Other findings  Trace free pelvic fluid.  IMPRESSION: 1. Mild enlargement of the uterus with heterogeneity and probable fibroids. 2. Nonspecific heterogeneous thickening of the endometrium to 2 cm. If bleeding remains unresponsive to hormonal or medical therapy, focal lesion work-up with sonohysterogram should be considered. Endometrial biopsy should also be considered in pre-menopausal patients at high risk for endometrial carcinoma. (Ref: Radiological Reasoning: Algorithmic Workup of Abnormal Vaginal Bleeding with Endovaginal Sonography and Sonohysterography. AJR 2008; 161:W96-04) 3. Ovaries not visualized.   Electronically Signed   By: Carey Bullocks M.D.   On: 05/04/2015 14:21   US Pelvis Complete  05/04/2015   CLINICAL DATA:  Pelvic pain with vaginal bleeding for 4 days. History of tubal ligation. Initial encounter.  EXAM: TRANSABDOMINAL AND TRANSVAGINAL ULTRASOUND OF PELVIS  TECHNIQUE: Both transabdominal and transvaginal ultrasound examinations of the pelvis were performed. Transabdominal technique was performed for  global imaging of the pelvis including uterus, ovaries, adnexal regions, and pelvic cul-de-sac. It was necessary to proceed with endovaginal exam following the transabdominal exam to visualize the ovaries to better advantage.  COMPARISON:  CT 12/11/2006.  Pelvic ultrasound 09/01/2005.  FINDINGS: Uterus  Measurements: 14.6 x 6.7 x 9.5 cm. The uterus is heterogeneous in echotexture with at least 1 probable fibroid in the left fundal region measuring up to 6.3 cm.  Endometrium  Thickness: There is heterogeneous thickening of the endometrium to 20 mm. No focal lesion apparent.  Right ovary  Measurements: Not visualized secondary to prominent bowel gas.  Left ovary  Measurements: Not visualized secondary to prominent bowel gas.  Other findings  Trace free pelvic fluid.  IMPRESSION: 1. Mild enlargement of the uterus with heterogeneity and probable fibroids. 2. Nonspecific heterogeneous thickening of the endometrium to 2 cm. If bleeding remains unresponsive to hormonal or medical therapy, focal lesion work-up with sonohysterogram should be considered. Endometrial biopsy should also be considered in pre-menopausal patients at high risk for endometrial carcinoma. (Ref: Radiological Reasoning: Algorithmic Workup of Abnormal Vaginal Bleeding with Endovaginal Sonography and Sonohysterography. AJR 2008; 540:J81-19) 3. Ovaries not visualized.   Electronically Signed   By: Carey Bullocks M.D.   On: 05/04/2015 14:21   I have personally reviewed and evaluated these images and lab results as part of my medical decision-making.   MDM   Final diagnoses:  Dysfunctional uterine bleeding  Patient has been on Megace 40 mg daily for her dysfunctional uterine bleeding.   Minimal bleeding from os today.  Pelvic ultrasound shows no frank specific abnormalities. Discussed with gynecologist on call Dr. Shawnie Pons.   Will increase Megace to 40 mg 3 times a day until bleeding stops, then Megace 40 mg 2 times a day.  Follow-up with  gynecologist. Discussed with patient in great detail.   I personally performed the services described in this documentation, which was scribed in my presence. The recorded information has been reviewed and is accurate.     Mallory Hutching, MD 05/04/15 [phone]

## 2015-05-04 NOTE — ED Notes (Signed)
Call placed to [facility] to bring Megace as pharmacy closed and not available in ER

## 2015-05-04 NOTE — ED Notes (Signed)
DrCook saw pt returning to room , aware of completion of ultrasound

## 2015-05-04 NOTE — ED Notes (Signed)
Dr Adriana Simas in room to talk to pt .

## 2015-05-04 NOTE — ED Notes (Signed)
Vaginal bleeding started on Wed and got worse on Friday.  Soaking about 6-8 pads per hour.  Don't have OB/GYN at this time.

## 2015-05-04 NOTE — ED Notes (Signed)
Discharge instructions reviewed with pt by both Dr Adriana Simas and this nurse. Pt verbalized back what she needs to do and also verbalized understanding re- FU with OBGYN in am . No questions asked

## 2015-07-12 ENCOUNTER — Encounter (HOSPITAL_COMMUNITY): Payer: Self-pay | Admitting: Emergency Medicine

## 2015-07-12 ENCOUNTER — Emergency Department (HOSPITAL_COMMUNITY)
Admission: EM | Admit: 2015-07-12 | Discharge: 2015-07-12 | Disposition: A | Discharge status: Home / Self Care | Payer: 59 | Attending: Emergency Medicine | Admitting: Emergency Medicine

## 2015-07-12 ENCOUNTER — Emergency Department (HOSPITAL_COMMUNITY): Payer: 59

## 2015-07-12 DIAGNOSIS — Z791 Long term (current) use of non-steroidal anti-inflammatories (NSAID): Secondary | ICD-10-CM | POA: Diagnosis not present

## 2015-07-12 DIAGNOSIS — J069 Acute upper respiratory infection, unspecified: Secondary | ICD-10-CM | POA: Diagnosis not present

## 2015-07-12 DIAGNOSIS — E079 Disorder of thyroid, unspecified: Secondary | ICD-10-CM | POA: Insufficient documentation

## 2015-07-12 DIAGNOSIS — Z79899 Other long term (current) drug therapy: Secondary | ICD-10-CM | POA: Insufficient documentation

## 2015-07-12 DIAGNOSIS — H748X3 Other specified disorders of middle ear and mastoid, bilateral: Secondary | ICD-10-CM | POA: Insufficient documentation

## 2015-07-12 DIAGNOSIS — F17210 Nicotine dependence, cigarettes, uncomplicated: Secondary | ICD-10-CM | POA: Diagnosis not present

## 2015-07-12 DIAGNOSIS — I10 Essential (primary) hypertension: Secondary | ICD-10-CM | POA: Diagnosis not present

## 2015-07-12 DIAGNOSIS — R05 Cough: Secondary | ICD-10-CM | POA: Diagnosis present

## 2015-07-12 MED ORDER — PREDNISONE 50 MG PO TABS
60.0000 mg | ORAL_TABLET | Freq: Once | ORAL | Status: AC
  Administered 2015-07-12: 60 mg via ORAL
  Filled 2015-07-12: qty 1

## 2015-07-12 MED ORDER — ALBUTEROL SULFATE HFA 108 (90 BASE) MCG/ACT IN AERS
2.0000 | INHALATION_SPRAY | Freq: Once | RESPIRATORY_TRACT | Status: AC
  Administered 2015-07-12: 2 via RESPIRATORY_TRACT
  Filled 2015-07-12: qty 6.7

## 2015-07-12 MED ORDER — GUAIFENESIN-CODEINE 100-10 MG/5ML PO SYRP: 10.0000 mL | ORAL_SOLUTION | Freq: Three times a day (TID) | ORAL | Status: DC | PRN

## 2015-07-12 MED ORDER — PREDNISONE 20 MG PO TABS: 40.0000 mg | ORAL_TABLET | Freq: Every day | ORAL | Status: DC

## 2015-07-12 NOTE — Discharge Instructions (Signed)

## 2015-07-12 NOTE — ED Notes (Signed)
RT at bedside.

## 2015-07-12 NOTE — ED Provider Notes (Signed)
CSN: [id number]     Arrival date & time 07/12/15  0759 History   First MD Initiated Contact with Patient 07/12/15 0831     Chief Complaint  Patient presents with  . Cough     (Consider location/radiation/quality/duration/timing/severity/associated sxs/prior Treatment) HPI   Mallory Smith is a 54 y.o. female who presents to the Emergency Department complaining of occasionally productive cough and nasal congestion sinus pressure for 3-4 days. She also reports mild sore throat and hoarseness to her voice.Marland Kitchen. She's been taking over-the-counter Mucinex and also cough syrup without relief. She states cough is occasionally productive but mostly dry chest feels tight with excessive coughing. She states that she was seen by her PMD about one month ago for same ans completed a course of antibiotics with relief.  She denies fever, shortness of breath, nausea vomiting headaches and rash. Cough is worse with lying down. She denies recent sick contacts.   Past Medical History  Diagnosis Date  . Hypertension   . Thyroid disease    Past Surgical History  Procedure Laterality Date  . Tubal ligation    . Nm thyroid stim supress     Family History  Problem Relation Age of Onset  . Diabetes Other   . Stroke Other    Social History  Substance Use Topics  . Smoking status: Current Every Day Smoker -- 0.03 packs/day for 20 years    Types: Cigarettes  . Smokeless tobacco: Never Used  . Alcohol Use: Yes     Comment: occ   OB History    Gravida Para Term Preterm AB TAB SAB Ectopic Multiple Living   3 3 3       3      Review of Systems  Constitutional: Negative for fever, chills, activity change and appetite change.  HENT: Positive for congestion, rhinorrhea, sinus pressure, sneezing and sore throat. Negative for facial swelling and trouble swallowing.   Eyes: Negative for visual disturbance.  Respiratory: Positive for cough and chest tightness. Negative for shortness of breath, wheezing and  stridor.   Gastrointestinal: Negative for nausea and vomiting.  Genitourinary: Negative for dysuria.  Musculoskeletal: Negative for neck pain and neck stiffness.  Skin: Negative.  Negative for rash.  Neurological: Negative for dizziness, weakness, numbness and headaches.  Hematological: Negative for adenopathy.  Psychiatric/Behavioral: Negative for confusion.  All other systems reviewed and are negative.     Allergies  Review of patient's allergies indicates no known allergies.  Home Medications   Prior to Admission medications   Medication Sig Start Date End Date Taking? Authorizing Provider  acetaminophen (TYLENOL) 500 MG tablet Take 1,000 mg by mouth every 6 (six) hours as needed for pain.     Historical Provider, MD  ALPRAZolam Prudy Feeler(XANAX) 0.25 MG tablet Take 0.25 mg by mouth daily. 09/21/14   Historical Provider, MD  amLODipine (NORVASC) 5 MG tablet Take 5 mg by mouth daily. 10/15/14   Historical Provider, MD  amoxicillin (AMOXIL) 500 MG capsule Take 1 capsule (500 mg total) by mouth 3 (three) times daily. Patient not taking: Reported on 10/24/2014 10/12/12   Burgess AmorJulie Idol, PA-C  celecoxib (CELEBREX) 100 MG capsule Take 1 capsule (100 mg total) by mouth 2 (two) times daily. 10/24/14   Ivery QualeHobson Bryant, PA-C  ferrous sulfate 325 (65 FE) MG tablet Take 325 mg by mouth daily. 02/18/15   Historical Provider, MD  fluticasone (FLONASE) 50 MCG/ACT nasal spray Place 2 sprays into the nose daily as needed for allergies. FOR CONGESTION  Historical Provider, MD  guaiFENesin (MUCINEX) 600 MG 12 hr tablet Take 600 mg by mouth 2 (two) times daily as needed. FOR CONGESTION    Historical Provider, MD  HYDROcodone-acetaminophen (NORCO/VICODIN) 5-325 MG per tablet Take 1 tablet by mouth every 4 (four) hours as needed. Patient taking differently: Take 1 tablet by mouth every 4 (four) hours as needed for moderate pain.  10/24/14   Ivery Quale, PA-C  ibuprofen (ADVIL,MOTRIN) 200 MG tablet Take 200 mg by mouth every 6  (six) hours as needed for pain.    Historical Provider, MD  levothyroxine (SYNTHROID, LEVOTHROID) 175 MCG tablet Take 175 mcg by mouth daily. 10/14/14   Historical Provider, MD  lisinopril-hydrochlorothiazide (PRINZIDE,ZESTORETIC) 10-12.5 MG per tablet Take 1 tablet by mouth daily. 03/17/15   Historical Provider, MD  megestrol (MEGACE) 40 MG tablet Take 1 tablet (40 mg total) by mouth daily. 3 tablets daily until bleeding slows down, then 2 tablets daily 05/04/15   Donnetta Hutching, MD  metoprolol tartrate (LOPRESSOR) 25 MG tablet Take 1 tablet by mouth 2 (two) times daily. 10/14/14   Historical Provider, MD  Multiple Vitamin (MULTIVITAMIN WITH MINERALS) TABS tablet Take 1 tablet by mouth daily.    Historical Provider, MD   BP 142/81 mmHg  Pulse 87  Temp(Src) 98.4 F (36.9 C) (Oral)  Resp 18  Ht  (1.727 m)  Wt 97.523 kg  BMI 32.70 kg/m2  SpO2 100% Physical Exam  Constitutional: She is oriented to person, place, and time. She appears well-developed and well-nourished. No distress.  HENT:  Head: Atraumatic.  Right Ear: Tympanic membrane is not erythematous. A middle ear effusion is present.  Left Ear: Tympanic membrane is not erythematous. A middle ear effusion is present.  Nose: Mucosal edema present.  Mouth/Throat: Uvula is midline, oropharynx is clear and moist and mucous membranes are normal.  Neck: Normal range of motion. Neck supple.  Cardiovascular: Normal rate, regular rhythm and normal heart sounds.   Pulmonary/Chest: Effort normal. No respiratory distress. She has no wheezes.  Slightly diminished lung sounds bilaterally. No wheezing or rales. No accessory muscle use for breathing.  Abdominal: Soft. She exhibits no distension. There is no tenderness.  Musculoskeletal: Normal range of motion.  Lymphadenopathy:    She has no cervical adenopathy.  Neurological: She is alert and oriented to person, place, and time. Coordination normal.  Skin: Skin is warm and dry.  Psychiatric: She has  a normal mood and affect.  Nursing note and vitals reviewed.   ED Course  Procedures (including critical care time) Labs Review Labs Reviewed - No data to display  Imaging Review Dg Chest 2 View  07/12/2015  CLINICAL DATA:  Cough and congestion 2 weeks. EXAM: CHEST  2 VIEW COMPARISON:  07/07/2012 and 10/09/2009 FINDINGS: Lungs are adequately inflated without consolidation or effusion. Cardiomediastinal silhouette is within normal. There is mild degenerative change of the spine. IMPRESSION: No active cardiopulmonary disease. Electronically Signed   By: Elberta Fortis M.D.   On: 07/12/2015 09:12   I have personally reviewed and evaluated these images and lab results as part of my medical decision-making.    MDM   Final diagnoses:  URI (upper respiratory infection)    Patient is well-appearing. Vital signs are stable. No tachycardia, tachypnea, or hypoxia. Symptoms appear consistent with viral illness. Albuterol inhaler was dispensed. Prescription for prednisone patient agrees to follow-up with her PCP if needed. I do not feel that antibiotics are indicated at this time.    Traveion Ruddock,  PA-C 07/12/15 [id number]  Doug Sou, MD 07/12/15 [phone]

## 2015-07-12 NOTE — ED Notes (Signed)
Reports two weeks of cough, cold, congestion. Sore throat. Has been taking mucinex, delsom cough syrup, and flonase.

## 2015-10-03 ENCOUNTER — Encounter (HOSPITAL_COMMUNITY): Payer: Self-pay | Admitting: Emergency Medicine

## 2015-10-03 ENCOUNTER — Emergency Department (HOSPITAL_COMMUNITY)
Admission: EM | Admit: 2015-10-03 | Discharge: 2015-10-03 | Disposition: A | Discharge status: Home / Self Care | Payer: BLUE CROSS/BLUE SHIELD | Attending: Emergency Medicine | Admitting: Emergency Medicine

## 2015-10-03 DIAGNOSIS — J111 Influenza due to unidentified influenza virus with other respiratory manifestations: Secondary | ICD-10-CM | POA: Diagnosis not present

## 2015-10-03 DIAGNOSIS — Z79899 Other long term (current) drug therapy: Secondary | ICD-10-CM | POA: Diagnosis not present

## 2015-10-03 DIAGNOSIS — Z79818 Long term (current) use of other agents affecting estrogen receptors and estrogen levels: Secondary | ICD-10-CM | POA: Diagnosis not present

## 2015-10-03 DIAGNOSIS — I10 Essential (primary) hypertension: Secondary | ICD-10-CM | POA: Diagnosis not present

## 2015-10-03 DIAGNOSIS — H9209 Otalgia, unspecified ear: Secondary | ICD-10-CM | POA: Insufficient documentation

## 2015-10-03 DIAGNOSIS — F17210 Nicotine dependence, cigarettes, uncomplicated: Secondary | ICD-10-CM | POA: Insufficient documentation

## 2015-10-03 DIAGNOSIS — J029 Acute pharyngitis, unspecified: Secondary | ICD-10-CM | POA: Diagnosis present

## 2015-10-03 DIAGNOSIS — E079 Disorder of thyroid, unspecified: Secondary | ICD-10-CM | POA: Diagnosis not present

## 2015-10-03 DIAGNOSIS — Z791 Long term (current) use of non-steroidal anti-inflammatories (NSAID): Secondary | ICD-10-CM | POA: Insufficient documentation

## 2015-10-03 MED ORDER — OSELTAMIVIR PHOSPHATE 75 MG PO CAPS: 75.0000 mg | ORAL_CAPSULE | Freq: Two times a day (BID) | ORAL | Status: DC

## 2015-10-03 MED ORDER — IBUPROFEN 800 MG PO TABS: 800.0000 mg | ORAL_TABLET | Freq: Three times a day (TID) | ORAL | Status: DC

## 2015-10-03 MED ORDER — FLUTICASONE PROPIONATE 50 MCG/ACT NA SUSP: 1.0000 | Freq: Every day | NASAL | Status: DC

## 2015-10-03 NOTE — ED Notes (Signed)
Pt states that she has been running a fever, coughing, very congested, and has generalized body aches.  Last meds taken within an hour of arrival.

## 2015-10-03 NOTE — ED Provider Notes (Signed)
CSN: [id number]     Arrival date & time 10/03/15  1254 History   First MD Initiated Contact with Patient 10/03/15 1342     Chief Complaint  Patient presents with  . Generalized Body Aches  . Sore Throat     (Consider location/radiation/quality/duration/timing/severity/associated sxs/prior Treatment) HPI Comments: The patient is a 55 year old female, she started having a sore throat 2 nights ago, this has been persistent, she is now running a fever, having body aches, having congestion in the head and having a nonproductive cough. This has been persistent, she has tried Motrin with mild relief. She did not get a flu shot this year  Patient is a 55 y.o. female presenting with pharyngitis. The history is provided by the patient.  Sore Throat    Past Medical History  Diagnosis Date  . Hypertension   . Thyroid disease    Past Surgical History  Procedure Laterality Date  . Tubal ligation    . Nm thyroid stim supress     Family History  Problem Relation Age of Onset  . Diabetes Other   . Stroke Other    Social History  Substance Use Topics  . Smoking status: Current Every Day Smoker -- 0.50 packs/day for 20 years    Types: Cigarettes  . Smokeless tobacco: Never Used  . Alcohol Use: Yes     Comment: occ   OB History    Gravida Para Term Preterm AB TAB SAB Ectopic Multiple Living   Review of Systems  Constitutional: Positive for fever and chills.  HENT: Positive for ear pain and sore throat.   Respiratory: Positive for cough.   Musculoskeletal: Positive for myalgias.      Allergies  Review of patient's allergies indicates no known allergies.  Home Medications   Prior to Admission medications   Medication Sig Start Date End Date Taking? Authorizing Provider  acetaminophen (TYLENOL) 500 MG tablet Take 1,000 mg by mouth every 6 (six) hours as needed for pain.     Historical Provider, MD  ALPRAZolam Prudy Feeler) 0.25 MG tablet Take 0.25 mg by mouth  daily. 09/21/14   Historical Provider, MD  amLODipine (NORVASC) 5 MG tablet Take 5 mg by mouth daily. 10/15/14   Historical Provider, MD  celecoxib (CELEBREX) 100 MG capsule Take 1 capsule (100 mg total) by mouth 2 (two) times daily. 10/24/14   Ivery Quale, PA-C  ferrous sulfate 325 (65 FE) MG tablet Take 325 mg by mouth daily. 02/18/15   Historical Provider, MD  fluticasone (FLONASE) 50 MCG/ACT nasal spray Place 1 spray into both nostrils daily. 10/03/15   Eber Hong, MD  guaiFENesin (MUCINEX) 600 MG 12 hr tablet Take 600 mg by mouth 2 (two) times daily as needed. FOR CONGESTION    Historical Provider, MD  guaiFENesin-codeine (ROBITUSSIN AC) 100-10 MG/5ML syrup Take 10 mLs by mouth 3 (three) times daily as needed. 07/12/15   Tammy Triplett, PA-C  HYDROcodone-acetaminophen (NORCO/VICODIN) 5-325 MG per tablet Take 1 tablet by mouth every 4 (four) hours as needed. Patient taking differently: Take 1 tablet by mouth every 4 (four) hours as needed for moderate pain.  10/24/14   Ivery Quale, PA-C  ibuprofen (ADVIL,MOTRIN) 800 MG tablet Take 1 tablet (800 mg total) by mouth 3 (three) times daily. 10/03/15   Eber Hong, MD  levothyroxine (SYNTHROID, LEVOTHROID) 175 MCG tablet Take 175 mcg by mouth daily. 10/14/14   Historical Provider, MD  lisinopril-hydrochlorothiazide (  PRINZIDE,ZESTORETIC) 10-12.5 MG per tablet Take 1 tablet by mouth daily. 03/17/15   Historical Provider, MD  megestrol (MEGACE) 40 MG tablet Take 1 tablet (40 mg total) by mouth daily. 3 tablets daily until bleeding slows down, then 2 tablets daily 05/04/15   Donnetta Hutching, MD  metoprolol tartrate (LOPRESSOR) 25 MG tablet Take 1 tablet by mouth 2 (two) times daily. 10/14/14   Historical Provider, MD  Multiple Vitamin (MULTIVITAMIN WITH MINERALS) TABS tablet Take 1 tablet by mouth daily.    Historical Provider, MD  oseltamivir (TAMIFLU) 75 MG capsule Take 1 capsule (75 mg total) by mouth every 12 (twelve) hours. 10/03/15   Eber Hong, MD  predniSONE  (DELTASONE) 20 MG tablet Take 2 tablets (40 mg total) by mouth daily. For 5 days 07/12/15   Tammy Triplett, PA-C   BP 143/85 mmHg  Pulse 93  Temp(Src) 98.4 F (36.9 C) (Temporal)  Resp 18  Ht  (1.727 m)  Wt 205 lb (92.987 kg)  BMI 31.18 kg/m2  SpO2 100% Physical Exam  Constitutional: She appears well-developed and well-nourished.  HENT:  Head: Normocephalic and atraumatic.  Eyes: Conjunctivae are normal. Right eye exhibits no discharge. Left eye exhibits no discharge.  Pulmonary/Chest: Effort normal. No respiratory distress.  Neurological: She is alert. Coordination normal.  Skin: Skin is warm and dry. No rash noted. She is not diaphoretic. No erythema.  Psychiatric: She has a normal mood and affect.  Nursing note and vitals reviewed.   ED Course  Procedures (including critical care time) Labs Review Labs Reviewed - No data to display  Imaging Review No results found. I have personally reviewed and evaluated these images and lab results as part of my medical decision-making.    MDM   Final diagnoses:  Influenza    Likely flulike illness, vitals normal pharynx is clear, nostrils are clear, lungs are clear   Meds given in ED:  Medications - No data to display  New Prescriptions   FLUTICASONE (FLONASE) 50 MCG/ACT NASAL SPRAY    Place 1 spray into both nostrils daily.   IBUPROFEN (ADVIL,MOTRIN) 800 MG TABLET    Take 1 tablet (800 mg total) by mouth 3 (three) times daily.   OSELTAMIVIR (TAMIFLU) 75 MG CAPSULE    Take 1 capsule (75 mg total) by mouth every 12 (twelve) hours.      Eber Hong, MD 10/03/15 1352

## 2015-10-03 NOTE — Discharge Instructions (Signed)

## 2016-04-20 ENCOUNTER — Emergency Department (HOSPITAL_COMMUNITY)
Admission: EM | Admit: 2016-04-20 | Discharge: 2016-04-20 | Disposition: A | Discharge status: Home / Self Care | Payer: BLUE CROSS/BLUE SHIELD | Attending: Emergency Medicine | Admitting: Emergency Medicine

## 2016-04-20 ENCOUNTER — Encounter (HOSPITAL_COMMUNITY): Payer: Self-pay

## 2016-04-20 DIAGNOSIS — J069 Acute upper respiratory infection, unspecified: Secondary | ICD-10-CM | POA: Diagnosis not present

## 2016-04-20 DIAGNOSIS — I10 Essential (primary) hypertension: Secondary | ICD-10-CM | POA: Insufficient documentation

## 2016-04-20 DIAGNOSIS — R110 Nausea: Secondary | ICD-10-CM | POA: Diagnosis not present

## 2016-04-20 DIAGNOSIS — J018 Other acute sinusitis: Secondary | ICD-10-CM

## 2016-04-20 DIAGNOSIS — E039 Hypothyroidism, unspecified: Secondary | ICD-10-CM | POA: Diagnosis not present

## 2016-04-20 DIAGNOSIS — Z79899 Other long term (current) drug therapy: Secondary | ICD-10-CM | POA: Diagnosis not present

## 2016-04-20 DIAGNOSIS — R51 Headache: Secondary | ICD-10-CM | POA: Diagnosis present

## 2016-04-20 DIAGNOSIS — F17210 Nicotine dependence, cigarettes, uncomplicated: Secondary | ICD-10-CM | POA: Diagnosis not present

## 2016-04-20 MED ORDER — PREDNISONE 20 MG PO TABS
40.0000 mg | ORAL_TABLET | Freq: Once | ORAL | Status: AC
  Administered 2016-04-20: 40 mg via ORAL
  Filled 2016-04-20: qty 2

## 2016-04-20 MED ORDER — LORATADINE-PSEUDOEPHEDRINE ER 5-120 MG PO TB12: 1.0000 | ORAL_TABLET | Freq: Two times a day (BID) | ORAL | 0 refills | Status: DC

## 2016-04-20 MED ORDER — AMOXICILLIN 500 MG PO CAPS: 500.0000 mg | ORAL_CAPSULE | Freq: Three times a day (TID) | ORAL | 0 refills | Status: DC

## 2016-04-20 MED ORDER — PSEUDOEPHEDRINE HCL 60 MG PO TABS
60.0000 mg | ORAL_TABLET | Freq: Once | ORAL | Status: AC
  Administered 2016-04-20: 60 mg via ORAL
  Filled 2016-04-20: qty 1

## 2016-04-20 MED ORDER — AMOXICILLIN 250 MG PO CAPS
500.0000 mg | ORAL_CAPSULE | Freq: Once | ORAL | Status: AC
  Administered 2016-04-20: 500 mg via ORAL
  Filled 2016-04-20: qty 2

## 2016-04-20 MED ORDER — DEXAMETHASONE 4 MG PO TABS: 4.0000 mg | ORAL_TABLET | Freq: Two times a day (BID) | ORAL | 0 refills | Status: DC

## 2016-04-20 NOTE — ED Triage Notes (Signed)
Complain of swelling in cheeks, sinus pressure and hoarseness for weeks

## 2016-04-20 NOTE — Discharge Instructions (Signed)
Please increase fluids. Please use Tylenol every 4 hours or ibuprofen every 6 hours for fever and chills. Wash hands frequently. Keep your distance from others. Use Amoxil 3 times daily, use Decadron 2 times daily along with Claritin-D for congestion/cough.

## 2016-04-20 NOTE — ED Provider Notes (Signed)
[facility]-EMERGENCY DEPT Provider Note   CSN: [id number] Arrival date & time: 04/20/16  0814     History   Chief Complaint Chief Complaint  Patient presents with  . Facial Pain    HPI Mallory Smith is a 55 y.o. female.  Patient is a 55 year old female who presents to the emergency department with a complaint of congestion and facial swelling.  The patient states that this problem has been going on for a few weeks now. Patient complains of symptoms with pressure behind her eye's, hoarseness, chills, nasal congestion, fever, and nausea. The patient states that on yesterday her face was swollen and her supervisor thought she had been crying or had been injured. She states that the pressure is getting in intensity worse and is now beginning to interfere with her work and her activities of daily living. She's not had any vision changes. She's had no difficulty with speech, swallowing, or use of her extremities. She's not observed any unusual rash. She has tried several over-the-counter remedies with minimal success, and presents now for evaluation.      Past Medical History:  Diagnosis Date  . Hypertension   . Thyroid disease     Patient Active Problem List   Diagnosis Date Noted  . HYPOTHYROIDISM, POST-RADIOACTIVE IODINE 04/03/2009  . FATIGUE 04/03/2009  . GOITER, MULTINODULAR 11/28/2008  . 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 GERD 05/30/2007  . HYPERTENSION 03/28/2007    Past Surgical History:  Procedure Laterality Date  . NM THYROID STIM SUPRESS    . TUBAL LIGATION      OB History    Gravida Para Term Preterm AB Living   3 3 3     3    SAB TAB Ectopic Multiple Live Births                   Home Medications    Prior to Admission medications   Medication Sig Start Date End Date Taking? Authorizing Provider  acetaminophen (TYLENOL) 500 MG tablet Take 1,000 mg by mouth every 6 (six) hours as needed for pain.     Historical Provider, MD  ALPRAZolam Prudy Feeler(XANAX) 0.25  MG tablet Take 0.25 mg by mouth daily. 09/21/14   Historical Provider, MD  amLODipine (NORVASC) 5 MG tablet Take 5 mg by mouth daily. 10/15/14   Historical Provider, MD  amoxicillin (AMOXIL) 500 MG capsule Take 1 capsule (500 mg total) by mouth 3 (three) times daily. 04/20/16   Ivery QualeHobson Aundre Hietala, PA-C  celecoxib (CELEBREX) 100 MG capsule Take 1 capsule (100 mg total) by mouth 2 (two) times daily. 10/24/14   Ivery QualeHobson Dru Laurel, PA-C  dexamethasone (DECADRON) 4 MG tablet Take 1 tablet (4 mg total) by mouth 2 (two) times daily with a meal. 04/20/16   Ivery QualeHobson Venessa Wickham, PA-C  ferrous sulfate 325 (65 FE) MG tablet Take 325 mg by mouth daily. 02/18/15   Historical Provider, MD  fluticasone (FLONASE) 50 MCG/ACT nasal spray Place 1 spray into both nostrils daily. 10/03/15   Eber HongBrian Miller, MD  guaiFENesin (MUCINEX) 600 MG 12 hr tablet Take 600 mg by mouth 2 (two) times daily as needed. FOR CONGESTION    Historical Provider, MD  guaiFENesin-codeine (ROBITUSSIN AC) 100-10 MG/5ML syrup Take 10 mLs by mouth 3 (three) times daily as needed. 07/12/15   Tammy Triplett, PA-C  HYDROcodone-acetaminophen (NORCO/VICODIN) 5-325 MG per tablet Take 1 tablet by mouth every 4 (four) hours as needed. Patient taking differently: Take 1 tablet by mouth every 4 (four) hours as needed  for moderate pain.  10/24/14   Ivery Quale, PA-C  ibuprofen (ADVIL,MOTRIN) 800 MG tablet Take 1 tablet (800 mg total) by mouth 3 (three) times daily. 10/03/15   Eber Hong, MD  levothyroxine (SYNTHROID, LEVOTHROID) 175 MCG tablet Take 175 mcg by mouth daily. 10/14/14   Historical Provider, MD  lisinopril-hydrochlorothiazide (PRINZIDE,ZESTORETIC) 10-12.5 MG per tablet Take 1 tablet by mouth daily. 03/17/15   Historical Provider, MD  loratadine-pseudoephedrine (CLARITIN-D 12 HOUR) 5-120 MG tablet Take 1 tablet by mouth 2 (two) times daily. 04/20/16   Ivery Quale, PA-C  megestrol (MEGACE) 40 MG tablet Take 1 tablet (40 mg total) by mouth daily. 3 tablets daily until bleeding slows  down, then 2 tablets daily 05/04/15   Donnetta Hutching, MD  metoprolol tartrate (LOPRESSOR) 25 MG tablet Take 1 tablet by mouth 2 (two) times daily. 10/14/14   Historical Provider, MD  Multiple Vitamin (MULTIVITAMIN WITH MINERALS) TABS tablet Take 1 tablet by mouth daily.    Historical Provider, MD  oseltamivir (TAMIFLU) 75 MG capsule Take 1 capsule (75 mg total) by mouth every 12 (twelve) hours. 10/03/15   Eber Hong, MD  predniSONE (DELTASONE) 20 MG tablet Take 2 tablets (40 mg total) by mouth daily. For 5 days 07/12/15   Pauline Aus, PA-C    Family History Family History  Problem Relation Age of Onset  . Diabetes Other   . Stroke Other     Social History Social History  Substance Use Topics  . Smoking status: Current Every Day Smoker    Packs/day: 0.50    Years: 20.00    Types: Cigarettes  . Smokeless tobacco: Never Used  . Alcohol use Yes     Comment: occ     Allergies   Review of patient's allergies indicates no known allergies.   Review of Systems Review of Systems  Constitutional: Positive for chills, fatigue and fever.  HENT: Positive for congestion, postnasal drip, sinus pressure and sore throat.   Respiratory: Positive for cough.   Gastrointestinal: Positive for nausea.  Musculoskeletal: Positive for myalgias.  Skin: Negative for rash.  All other systems reviewed and are negative.    Physical Exam Updated Vital Signs BP 154/96 (BP Location: Right Arm)   Pulse 88   Temp 97.9 F (36.6 C) (Oral)   Resp 16   Ht 5\' 9"  (1.753 m)   Wt 83.9 kg   SpO2 100%   BMI 27.32 kg/m   Physical Exam  Constitutional: She is oriented to person, place, and time. She appears well-developed and well-nourished.  Non-toxic appearance.  HENT:  Head: Normocephalic.  Right Ear: Tympanic membrane and external ear normal.  Left Ear: Tympanic membrane and external ear normal.  There is increase redness of the posterior pharynx on. The uvula is enlarged on. The airway is patent,  there is no exudate noted.  Nasal congestion present. Mild tenderness to percussion over the sinuses.  Eyes: EOM and lids are normal. Pupils are equal, round, and reactive to light.  Neck: Normal range of motion. Neck supple. Carotid bruit is not present.  Cardiovascular: Normal rate, regular rhythm, normal heart sounds, intact distal pulses and normal pulses.   Pulmonary/Chest: Breath sounds normal. No respiratory distress.  Abdominal: Soft. Bowel sounds are normal. There is no tenderness. There is no guarding.  Musculoskeletal: Normal range of motion.  Lymphadenopathy:       Head (right side): No submandibular adenopathy present.       Head (left side): No submandibular adenopathy present.  She has no cervical adenopathy.  Neurological: She is alert and oriented to person, place, and time. She has normal strength. No cranial nerve deficit or sensory deficit.  Skin: Skin is warm and dry.  Psychiatric: She has a normal mood and affect. Her speech is normal.  Nursing note and vitals reviewed.    ED Treatments / Results  Labs (all labs ordered are listed, but only abnormal results are displayed) Labs Reviewed - No data to display  EKG  EKG Interpretation None       Radiology No results found.  Procedures Procedures (including critical care time)  Medications Ordered in ED Medications  amoxicillin (AMOXIL) capsule 500 mg (not administered)  predniSONE (DELTASONE) tablet 40 mg (not administered)  pseudoephedrine (SUDAFED) tablet 60 mg (not administered)     Initial Impression / Assessment and Plan / ED Course  I have reviewed the triage vital signs and the nursing notes.  Pertinent labs & imaging results that were available during my care of the patient were reviewed by me and considered in my medical decision making (see chart for details).  Clinical Course    **I have reviewed nursing notes, vital signs, and all appropriate lab and imaging results for this  patient.*  Final Clinical Impressions(s) / ED Diagnoses  Vital signs reviewed. The examination suggest sinusitis and upper respiratory infection. The patient will be treated with Amoxil, Decadron, and Claritin-D. Patient advised to increase fluids, use Tylenol or ibuprofen for fever and chills. And to wash hands frequently. Patient is in agreement with this discharge plan. She will return if not improving.    Final diagnoses:  Other subacute sinusitis  URI (upper respiratory infection)    New Prescriptions New Prescriptions   AMOXICILLIN (AMOXIL) 500 MG CAPSULE    Take 1 capsule (500 mg total) by mouth 3 (three) times daily.   DEXAMETHASONE (DECADRON) 4 MG TABLET    Take 1 tablet (4 mg total) by mouth 2 (two) times daily with a meal.   LORATADINE-PSEUDOEPHEDRINE (CLARITIN-D 12 HOUR) 5-120 MG TABLET    Take 1 tablet by mouth 2 (two) times daily.     Ivery Quale, PA-C 04/20/16 [id number]    Rolland Porter, MD 04/27/16 2351

## 2016-05-13 ENCOUNTER — Emergency Department (HOSPITAL_COMMUNITY)
Admission: EM | Admit: 2016-05-13 | Discharge: 2016-05-13 | Disposition: A | Discharge status: Home / Self Care | Payer: BLUE CROSS/BLUE SHIELD | Attending: Emergency Medicine | Admitting: Emergency Medicine

## 2016-05-13 ENCOUNTER — Encounter (HOSPITAL_COMMUNITY): Payer: Self-pay | Admitting: Emergency Medicine

## 2016-05-13 DIAGNOSIS — I10 Essential (primary) hypertension: Secondary | ICD-10-CM | POA: Diagnosis not present

## 2016-05-13 DIAGNOSIS — E039 Hypothyroidism, unspecified: Secondary | ICD-10-CM | POA: Diagnosis not present

## 2016-05-13 DIAGNOSIS — J0110 Acute frontal sinusitis, unspecified: Secondary | ICD-10-CM | POA: Insufficient documentation

## 2016-05-13 DIAGNOSIS — F17210 Nicotine dependence, cigarettes, uncomplicated: Secondary | ICD-10-CM | POA: Diagnosis not present

## 2016-05-13 DIAGNOSIS — R05 Cough: Secondary | ICD-10-CM

## 2016-05-13 DIAGNOSIS — R059 Cough, unspecified: Secondary | ICD-10-CM

## 2016-05-13 MED ORDER — DOXYCYCLINE HYCLATE 100 MG PO TABS
100.0000 mg | ORAL_TABLET | Freq: Once | ORAL | Status: AC
  Administered 2016-05-13: 100 mg via ORAL
  Filled 2016-05-13: qty 1

## 2016-05-13 MED ORDER — BENZONATATE 100 MG PO CAPS: 100.0000 mg | ORAL_CAPSULE | Freq: Three times a day (TID) | ORAL | 0 refills | Status: DC

## 2016-05-13 MED ORDER — DOXYCYCLINE HYCLATE 100 MG PO CAPS: 100.0000 mg | ORAL_CAPSULE | Freq: Two times a day (BID) | ORAL | 0 refills | Status: AC

## 2016-05-13 MED ORDER — MAGIC MOUTHWASH W/LIDOCAINE: 5.0000 mL | Freq: Three times a day (TID) | ORAL | 0 refills | Status: AC

## 2016-05-13 MED ORDER — BENZONATATE 100 MG PO CAPS
100.0000 mg | ORAL_CAPSULE | Freq: Once | ORAL | Status: AC
  Administered 2016-05-13: 100 mg via ORAL
  Filled 2016-05-13: qty 1

## 2016-05-13 NOTE — ED Provider Notes (Signed)
[facility]-EMERGENCY DEPT Provider Note   CSN: [id number] Arrival date & time: 05/13/16  0830    .By signing my name below, I, Sonum Patel, attest that this documentation has been prepared under the direction and in the presence of Marily Memos, MD. Electronically Signed: Sonum Patel, Neurosurgeon. 05/13/16. 9:22 AM.  History   Chief Complaint Chief Complaint  Patient presents with  . Cough   The history is provided by the patient. No language interpreter was used.     HPI Comments: Mallory Smith is a 55 y.o. female who presents to the Emergency Department complaining of persistent, unchanged sinus congestion with associated cough, sore throat, and ear pain for the past 3 weeks. She reports being seen in the ED on 04/20/16 for the same and states she was discharged home with amoxicillin, Decadron, and Claritin-D. She states her symptoms improved slightly with the medications but did not fully resolve.    Past Medical History:  Diagnosis Date  . Hypertension   . Thyroid disease     Patient Active Problem List   Diagnosis Date Noted  . HYPOTHYROIDISM, POST-RADIOACTIVE IODINE 04/03/2009  . FATIGUE 04/03/2009  . GOITER, MULTINODULAR 11/28/2008  . 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 GERD 05/30/2007  . HYPERTENSION 03/28/2007    Past Surgical History:  Procedure Laterality Date  . NM THYROID STIM SUPRESS    . TUBAL LIGATION      OB History    Gravida Para Term Preterm AB Living   3 3 3     3    SAB TAB Ectopic Multiple Live Births                   Home Medications    Prior to Admission medications   Medication Sig Start Date End Date Taking? Authorizing Provider  acetaminophen (TYLENOL) 500 MG tablet Take 1,000 mg by mouth every 6 (six) hours as needed for pain.     Historical Provider, MD  ALPRAZolam Prudy Feeler) 0.25 MG tablet Take 0.25 mg by mouth daily. 09/21/14   Historical Provider, MD  amLODipine (NORVASC) 5 MG tablet Take 5 mg by mouth daily. 10/15/14   Historical  Provider, MD  amoxicillin (AMOXIL) 500 MG capsule Take 1 capsule (500 mg total) by mouth 3 (three) times daily. 04/20/16   Ivery Quale, PA-C  benzonatate (TESSALON) 100 MG capsule Take 1 capsule (100 mg total) by mouth every 8 (eight) hours. 05/13/16   Marily Memos, MD  celecoxib (CELEBREX) 100 MG capsule Take 1 capsule (100 mg total) by mouth 2 (two) times daily. 10/24/14   Ivery Quale, PA-C  dexamethasone (DECADRON) 4 MG tablet Take 1 tablet (4 mg total) by mouth 2 (two) times daily with a meal. 04/20/16   Ivery Quale, PA-C  doxycycline (VIBRAMYCIN) 100 MG capsule Take 1 capsule (100 mg total) by mouth 2 (two) times daily. One po bid x 14 days 05/13/16 05/27/16  Marily Memos, MD  ferrous sulfate 325 (65 FE) MG tablet Take 325 mg by mouth daily. 02/18/15   Historical Provider, MD  fluticasone (FLONASE) 50 MCG/ACT nasal spray Place 1 spray into both nostrils daily. 10/03/15   Eber Hong, MD  guaiFENesin (MUCINEX) 600 MG 12 hr tablet Take 600 mg by mouth 2 (two) times daily as needed. FOR CONGESTION    Historical Provider, MD  guaiFENesin-codeine (ROBITUSSIN AC) 100-10 MG/5ML syrup Take 10 mLs by mouth 3 (three) times daily as needed. 07/12/15   Tammy Triplett, PA-C  HYDROcodone-acetaminophen (NORCO/VICODIN) 5-325 MG per  tablet Take 1 tablet by mouth every 4 (four) hours as needed. Patient taking differently: Take 1 tablet by mouth every 4 (four) hours as needed for moderate pain.  10/24/14   Ivery Quale, PA-C  ibuprofen (ADVIL,MOTRIN) 800 MG tablet Take 1 tablet (800 mg total) by mouth 3 (three) times daily. 10/03/15   Eber Hong, MD  levothyroxine (SYNTHROID, LEVOTHROID) 175 MCG tablet Take 175 mcg by mouth daily. 10/14/14   Historical Provider, MD  lisinopril-hydrochlorothiazide (PRINZIDE,ZESTORETIC) 10-12.5 MG per tablet Take 1 tablet by mouth daily. 03/17/15   Historical Provider, MD  loratadine-pseudoephedrine (CLARITIN-D 12 HOUR) 5-120 MG tablet Take 1 tablet by mouth 2 (two) times daily. 04/20/16    Ivery Quale, PA-C  magic mouthwash w/lidocaine SOLN Take 5 mLs by mouth 3 (three) times daily. 05/13/16 05/20/16  Marily Memos, MD  megestrol (MEGACE) 40 MG tablet Take 1 tablet (40 mg total) by mouth daily. 3 tablets daily until bleeding slows down, then 2 tablets daily 05/04/15   Donnetta Hutching, MD  metoprolol tartrate (LOPRESSOR) 25 MG tablet Take 1 tablet by mouth 2 (two) times daily. 10/14/14   Historical Provider, MD  Multiple Vitamin (MULTIVITAMIN WITH MINERALS) TABS tablet Take 1 tablet by mouth daily.    Historical Provider, MD  oseltamivir (TAMIFLU) 75 MG capsule Take 1 capsule (75 mg total) by mouth every 12 (twelve) hours. 10/03/15   Eber Hong, MD  predniSONE (DELTASONE) 20 MG tablet Take 2 tablets (40 mg total) by mouth daily. For 5 days 07/12/15   Pauline Aus, PA-C    Family History Family History  Problem Relation Age of Onset  . Diabetes Other   . Stroke Other     Social History Social History  Substance Use Topics  . Smoking status: Current Every Day Smoker    Packs/day: 0.50    Years: 20.00    Types: Cigarettes  . Smokeless tobacco: Never Used  . Alcohol use Yes     Comment: occ     Allergies   Review of patient's allergies indicates no known allergies.   Review of Systems Review of Systems  HENT: Positive for congestion, ear pain, sinus pressure and sore throat.   Respiratory: Positive for cough.      Physical Exam Updated Vital Signs BP 131/84   Pulse 79   Temp 98 F (36.7 C)   Resp 18   Ht 5\' 9"  (1.753 m)   Wt 190 lb (86.2 kg)   SpO2 98%   BMI 28.06 kg/m   Physical Exam  Constitutional: She is oriented to person, place, and time. She appears well-developed and well-nourished.  HENT:  Head: Normocephalic and atraumatic.  Nose: Right sinus exhibits maxillary sinus tenderness and frontal sinus tenderness. Left sinus exhibits maxillary sinus tenderness and frontal sinus tenderness.  Mouth/Throat: Posterior oropharyngeal erythema present.    Swollen and boggy turbinates. Posterior oropharyngeal erythema. Multiple erythematous lesions with white border to the posterior oropharynx.   Cardiovascular: Normal rate, regular rhythm and normal heart sounds.  Exam reveals no gallop and no friction rub.   No murmur heard. Pulmonary/Chest: Effort normal and breath sounds normal. No respiratory distress. She has no wheezes. She has no rales.  Abdominal: Soft. She exhibits no distension. There is no tenderness.  Neurological: She is alert and oriented to person, place, and time.  Skin: Skin is warm and dry.  Psychiatric: She has a normal mood and affect.  Nursing note and vitals reviewed.    ED Treatments / Results  DIAGNOSTIC  STUDIES: Oxygen Saturation is 99% on RA, normal by my interpretation.    COORDINATION OF CARE: 9:13 AM Discussed treatment plan with pt at bedside and pt agreed to plan.    Labs (all labs ordered are listed, but only abnormal results are displayed) Labs Reviewed - No data to display  EKG  EKG Interpretation None       Radiology No results found.  Procedures Procedures (including critical care time)  Medications Ordered in ED Medications  doxycycline (VIBRA-TABS) tablet 100 mg (100 mg Oral Given 05/13/16 0928)  benzonatate (TESSALON) capsule 100 mg (100 mg Oral Given 05/13/16 [id number])     Initial Impression / Assessment and Plan / ED Course  I have reviewed the triage vital signs and the nursing notes.  Pertinent labs & imaging results that were available during my care of the patient were reviewed by me and considered in my medical decision making (see chart for details).  Clinical Course    Likely sinusitis without complications. Will put on 2 weeks of abx, cough meds and supportive care otherwise.   Final Clinical Impressions(s) / ED Diagnoses   Final diagnoses:  Cough  Acute frontal sinusitis, recurrence not specified    New Prescriptions Discharge Medication List as of 05/13/2016   9:24 AM    START taking these medications   Details  benzonatate (TESSALON) 100 MG capsule Take 1 capsule (100 mg total) by mouth every 8 (eight) hours., Starting Thu 05/13/2016, Print    doxycycline (VIBRAMYCIN) 100 MG capsule Take 1 capsule (100 mg total) by mouth 2 (two) times daily. One po bid x 14 days, Starting Thu 05/13/2016, Until Thu 05/27/2016, Print    magic mouthwash w/lidocaine SOLN Take 5 mLs by mouth 3 (three) times daily., Starting Thu 05/13/2016, Until Thu 05/20/2016, Print       I personally performed the services described in this documentation, which was scribed in my presence. The recorded information has been reviewed and is accurate.     Marily MemosJason Tamlyn Sides, MD 05/14/16 [phone]

## 2016-05-13 NOTE — ED Triage Notes (Signed)
Pt c/o productive cough-yellow sputum and sore throat. Pt also c/o pain and "white bumps" in mouth.

## 2016-07-01 ENCOUNTER — Emergency Department (HOSPITAL_COMMUNITY)
Admission: EM | Admit: 2016-07-01 | Discharge: 2016-07-01 | Disposition: A | Discharge status: Home / Self Care | Payer: BLUE CROSS/BLUE SHIELD | Attending: Emergency Medicine | Admitting: Emergency Medicine

## 2016-07-01 ENCOUNTER — Encounter (HOSPITAL_COMMUNITY): Payer: Self-pay | Admitting: Emergency Medicine

## 2016-07-01 DIAGNOSIS — I10 Essential (primary) hypertension: Secondary | ICD-10-CM | POA: Insufficient documentation

## 2016-07-01 DIAGNOSIS — Z7952 Long term (current) use of systemic steroids: Secondary | ICD-10-CM | POA: Insufficient documentation

## 2016-07-01 DIAGNOSIS — R0981 Nasal congestion: Secondary | ICD-10-CM

## 2016-07-01 DIAGNOSIS — B370 Candidal stomatitis: Secondary | ICD-10-CM | POA: Diagnosis not present

## 2016-07-01 DIAGNOSIS — Z791 Long term (current) use of non-steroidal anti-inflammatories (NSAID): Secondary | ICD-10-CM | POA: Insufficient documentation

## 2016-07-01 DIAGNOSIS — J3489 Other specified disorders of nose and nasal sinuses: Secondary | ICD-10-CM | POA: Diagnosis not present

## 2016-07-01 DIAGNOSIS — E039 Hypothyroidism, unspecified: Secondary | ICD-10-CM | POA: Insufficient documentation

## 2016-07-01 DIAGNOSIS — Z79899 Other long term (current) drug therapy: Secondary | ICD-10-CM | POA: Diagnosis not present

## 2016-07-01 DIAGNOSIS — F17210 Nicotine dependence, cigarettes, uncomplicated: Secondary | ICD-10-CM | POA: Diagnosis not present

## 2016-07-01 MED ORDER — NYSTATIN 100000 UNIT/ML MT SUSP: 500000.0000 [IU] | Freq: Four times a day (QID) | OROMUCOSAL | 0 refills | Status: DC

## 2016-07-01 NOTE — Discharge Instructions (Signed)
Please read and follow all provided instructions.  Your diagnoses today include:  1. Sinus congestion   2. Oral candidiasis     Tests performed today include: Vital signs. See below for your results today.   Medications prescribed:  Take as prescribed   Home care instructions:  Follow any educational materials contained in this packet.  Follow-up instructions: Please follow-up with your primary care provider for further evaluation of symptoms and treatment   Return instructions:  Please return to the Emergency Department if you do not get better, if you get worse, or new symptoms OR  - Fever (temperature greater than 101.40F)  - Bleeding that does not stop with holding pressure to the area    -Severe pain (please note that you may be more sore the day after your accident)  - Chest Pain  - Difficulty breathing  - Severe nausea or vomiting  - Inability to tolerate food and liquids  - Passing out  - Skin becoming red around your wounds  - Change in mental status (confusion or lethargy)  - New numbness or weakness    Please return if you have any other emergent concerns.  Additional Information:  Your vital signs today were: BP (!) 148/104 Comment: has not been taking meds   Pulse 83    Temp 98.1 F (36.7 C) (Oral)    Resp 18    Ht 5\' 9"  (1.753 m)    Wt 81.6 kg    SpO2 99%    BMI 26.58 kg/m  If your blood pressure (BP) was elevated above 135/85 this visit, please have this repeated by your doctor within one month. ---------------

## 2016-07-01 NOTE — ED Triage Notes (Signed)
Pt states she was tx twice for sinus infection in September and developed blisters in mouth.  States her mouth hurts and she cannot taste.  Was given some mouthwash before, but it did not help.

## 2016-07-01 NOTE — ED Provider Notes (Signed)
[facility]-EM[facility]Y DEPT Provider Note   CSN: [id number] Arrival date & time: 07/01/16  [id number]     History   Chief Complaint Chief Complaint  Patient presents with  . 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 female.  HPI  55 y.o. female  presents to the Emergency Department today complaining of sinus congestion and decrease sensation to taste and oral pain x 2 weeks. Seen in past for sinusitis and given 2 rounds of ABX with moderate relief. Notes continued congestion despite Netty Pot and OTC remedies. No fevers. No N/V. No headaches. Notes she just wants to be able to get rid of strange taste in mouth after all the antibiotics she has taken. No other symptoms noted  Past Medical History:  Diagnosis Date  . Hypertension   . Thyroid disease     Patient Active Problem List   Diagnosis Date Noted  . HYPOTHYROIDISM, POST-RADIOACTIVE IODINE 04/03/2009  . FATIGUE 04/03/2009  . GOITER, MULTINODULAR 11/28/2008  . 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 GERD 05/30/2007  . HYPERTENSION 03/28/2007    Past Surgical History:  Procedure Laterality Date  . NM THYROID STIM SUPRESS    . TUBAL LIGATION      OB History    Gravida Para Term Preterm AB Living   3 3 3     3    SAB TAB Ectopic Multiple Live Births                   Home Medications    Prior to Admission medications   Medication Sig Start Date End Date Taking? Authorizing Provider  acetaminophen (TYLENOL) 500 MG tablet Take 1,000 mg by mouth every 6 (six) hours as needed for pain.     Historical Provider, MD  ALPRAZolam Prudy Feeler(XANAX) 0.25 MG tablet Take 0.25 mg by mouth daily. 09/21/14   Historical Provider, MD  amLODipine (NORVASC) 5 MG tablet Take 5 mg by mouth daily. 10/15/14   Historical Provider, MD  amoxicillin (AMOXIL) 500 MG capsule Take 1 capsule (500 mg total) by mouth 3 (three) times daily. 04/20/16   Ivery QualeHobson Bryant, PA-C  benzonatate (TESSALON) 100 MG capsule Take 1 capsule (100 mg total) by mouth every 8  (eight) hours. 05/13/16   Marily MemosJason Mesner, MD  celecoxib (CELEBREX) 100 MG capsule Take 1 capsule (100 mg total) by mouth 2 (two) times daily. 10/24/14   Ivery QualeHobson Bryant, PA-C  dexamethasone (DECADRON) 4 MG tablet Take 1 tablet (4 mg total) by mouth 2 (two) times daily with a meal. 04/20/16   Ivery QualeHobson Bryant, PA-C  ferrous sulfate 325 (65 FE) MG tablet Take 325 mg by mouth daily. 02/18/15   Historical Provider, MD  fluticasone (FLONASE) 50 MCG/ACT nasal spray Place 1 spray into both nostrils daily. 10/03/15   Eber HongBrian Miller, MD  guaiFENesin (MUCINEX) 600 MG 12 hr tablet Take 600 mg by mouth 2 (two) times daily as needed. FOR CONGESTION    Historical Provider, MD  guaiFENesin-codeine (ROBITUSSIN AC) 100-10 MG/5ML syrup Take 10 mLs by mouth 3 (three) times daily as needed. 07/12/15   Tammy Triplett, PA-C  HYDROcodone-acetaminophen (NORCO/VICODIN) 5-325 MG per tablet Take 1 tablet by mouth every 4 (four) hours as needed. Patient taking differently: Take 1 tablet by mouth every 4 (four) hours as needed for moderate pain.  10/24/14   Ivery QualeHobson Bryant, PA-C  ibuprofen (ADVIL,MOTRIN) 800 MG tablet Take 1 tablet (800 mg total) by mouth 3 (three) times daily. 10/03/15   Eber HongBrian Miller, MD  levothyroxine (SYNTHROID, LEVOTHROID) 175 MCG tablet Take 175 mcg by mouth daily. 10/14/14   Historical Provider, MD  lisinopril-hydrochlorothiazide (PRINZIDE,ZESTORETIC) 10-12.5 MG per tablet Take 1 tablet by mouth daily. 03/17/15   Historical Provider, MD  loratadine-pseudoephedrine (CLARITIN-D 12 HOUR) 5-120 MG tablet Take 1 tablet by mouth 2 (two) times daily. 04/20/16   Ivery QualeHobson Bryant, PA-C  megestrol (MEGACE) 40 MG tablet Take 1 tablet (40 mg total) by mouth daily. 3 tablets daily until bleeding slows down, then 2 tablets daily 05/04/15   Donnetta HutchingBrian Cook, MD  metoprolol tartrate (LOPRESSOR) 25 MG tablet Take 1 tablet by mouth 2 (two) times daily. 10/14/14   Historical Provider, MD  Multiple Vitamin (MULTIVITAMIN WITH MINERALS) TABS tablet Take 1 tablet  by mouth daily.    Historical Provider, MD  oseltamivir (TAMIFLU) 75 MG capsule Take 1 capsule (75 mg total) by mouth every 12 (twelve) hours. 10/03/15   Eber HongBrian Miller, MD  predniSONE (DELTASONE) 20 MG tablet Take 2 tablets (40 mg total) by mouth daily. For 5 days 07/12/15   Pauline Ausammy Triplett, PA-C    Family History Family History  Problem Relation Age of Onset  . Diabetes Other   . Stroke Other     Social History Social History  Substance Use Topics  . Smoking status: Current Every Day Smoker    Packs/day: 0.50    Years: 20.00    Types: Cigarettes  . Smokeless tobacco: Never Used  . Alcohol use Yes     Comment: occ     Allergies   Patient has no known allergies.   Review of Systems Review of Systems  Constitutional: Negative for fever.  HENT: Positive for congestion, postnasal drip, rhinorrhea and sinus pressure. Negative for sore throat and trouble swallowing.   Respiratory: Negative for shortness of breath.   Cardiovascular: Negative for chest pain.  Gastrointestinal: Negative for abdominal pain.   Physical Exam Updated Vital Signs BP (!) 148/104 Comment: has not been taking meds  Pulse 83   Temp 98.1 F (36.7 C) (Oral)   Resp 18   Ht 5\' 9"  (1.753 m)   Wt 81.6 kg   SpO2 99%   BMI 26.58 kg/m   Physical Exam  Constitutional: She is oriented to person, place, and time. Vital signs are normal. She appears well-developed and well-nourished.  HENT:  Head: Normocephalic.  Right Ear: Hearing normal.  Left Ear: Hearing normal.  Mouth/Throat: Uvula is midline, oropharynx is clear and moist and mucous membranes are normal. No trismus in the jaw. No uvula swelling. No posterior oropharyngeal edema, posterior oropharyngeal erythema or tonsillar abscesses.  Scattered white lesions on posterior tongue possibly related to Oral Candidiasis. No ulcerations. No bleeding.  Eyes: Conjunctivae and EOM are normal. Pupils are equal, round, and reactive to light.  Neck: Normal range  of motion. Neck supple.  Cardiovascular: Normal rate, regular rhythm, normal heart sounds and intact distal pulses.   Pulmonary/Chest: Effort normal and breath sounds normal.  Abdominal: Soft.  Musculoskeletal: Normal range of motion.  Neurological: She is alert and oriented to person, place, and time.  Skin: Skin is warm and dry.  Psychiatric: She has a normal mood and affect. Her speech is normal and behavior is normal. Thought content normal.  Nursing note and vitals reviewed.  ED Treatments / Results  Labs (all labs ordered are listed, but only abnormal results are displayed) Labs Reviewed - No data to display  EKG  EKG Interpretation None       Radiology No results  found.  Procedures Procedures (including critical care time)  Medications Ordered in ED Medications - No data to display   Initial Impression / Assessment and Plan / ED Course  I have reviewed the triage vital signs and the nursing notes.  Pertinent labs & imaging results that were available during my care of the patient were reviewed by me and considered in my medical decision making (see chart for details).  Clinical Course    Final Clinical Impressions(s) / ED Diagnoses  I have reviewed the relevant previous healthcare records. I obtained HPI from historian.  ED Course:  Assessment: Pt is a 55yF who presents with sinus congestion x 2 weeks despite oral ABX and change in taste on tongue. On exam, pt in NAD. Nontoxic/nonseptic appearing. VSS. Afebrile. Lungs CTA. Heart RRR. Posteriortongue with possible oral candidiasis. Likely due to continuous ABX usage for sinusitis. No ulcers noted. No other acute abnormalities identified. No leukoplakia. Will trial oral nystatin. Counseled on OTC remedies for sinus congestion. Plan is to DC home with follow up to PCP. At time of discharge, Patient is in no acute distress. Vital Signs are stable. Patient is able to ambulate. Patient able to tolerate PO.    Disposition/Plan: DC Home Additional Verbal discharge instructions given and discussed with patient.  Pt Instructed to f/u with PCP in the next week for evaluation and treatment of symptoms. Return precautions given Pt acknowledges and agrees with plan  Supervising Physician Samuel Jester, DO   Final diagnoses:  Sinus congestion  Oral candidiasis    New Prescriptions New Prescriptions   No medications on file     Audry Pili, PA-C 07/01/16 1005    Samuel Jester, DO 07/03/16 2017

## 2017-01-29 ENCOUNTER — Encounter (HOSPITAL_COMMUNITY): Payer: Self-pay | Admitting: Emergency Medicine

## 2017-01-29 ENCOUNTER — Emergency Department (HOSPITAL_COMMUNITY): Payer: Self-pay

## 2017-01-29 ENCOUNTER — Inpatient Hospital Stay (HOSPITAL_COMMUNITY)
Admission: EM | Admit: 2017-01-29 | Discharge: 2017-01-30 | DRG: 690 | Disposition: A | Discharge status: Home / Self Care | Payer: Self-pay | Attending: Internal Medicine | Admitting: Internal Medicine

## 2017-01-29 DIAGNOSIS — Z6825 Body mass index (BMI) 25.0-25.9, adult: Secondary | ICD-10-CM

## 2017-01-29 DIAGNOSIS — T502X5A Adverse effect of carbonic-anhydrase inhibitors, benzothiadiazides and other diuretics, initial encounter: Secondary | ICD-10-CM | POA: Diagnosis present

## 2017-01-29 DIAGNOSIS — R109 Unspecified abdominal pain: Secondary | ICD-10-CM | POA: Diagnosis present

## 2017-01-29 DIAGNOSIS — E669 Obesity, unspecified: Secondary | ICD-10-CM | POA: Diagnosis present

## 2017-01-29 DIAGNOSIS — E876 Hypokalemia: Secondary | ICD-10-CM | POA: Diagnosis present

## 2017-01-29 DIAGNOSIS — E860 Dehydration: Secondary | ICD-10-CM | POA: Diagnosis present

## 2017-01-29 DIAGNOSIS — F17210 Nicotine dependence, cigarettes, uncomplicated: Secondary | ICD-10-CM | POA: Diagnosis present

## 2017-01-29 DIAGNOSIS — E018 Other iodine-deficiency related thyroid disorders and allied conditions: Secondary | ICD-10-CM | POA: Diagnosis present

## 2017-01-29 DIAGNOSIS — E871 Hypo-osmolality and hyponatremia: Secondary | ICD-10-CM | POA: Diagnosis present

## 2017-01-29 DIAGNOSIS — R1011 Right upper quadrant pain: Secondary | ICD-10-CM

## 2017-01-29 DIAGNOSIS — I10 Essential (primary) hypertension: Secondary | ICD-10-CM | POA: Diagnosis present

## 2017-01-29 DIAGNOSIS — E039 Hypothyroidism, unspecified: Secondary | ICD-10-CM | POA: Diagnosis present

## 2017-01-29 DIAGNOSIS — N3090 Cystitis, unspecified without hematuria: Secondary | ICD-10-CM | POA: Insufficient documentation

## 2017-01-29 DIAGNOSIS — Z823 Family history of stroke: Secondary | ICD-10-CM

## 2017-01-29 DIAGNOSIS — Z833 Family history of diabetes mellitus: Secondary | ICD-10-CM

## 2017-01-29 DIAGNOSIS — N390 Urinary tract infection, site not specified: Principal | ICD-10-CM | POA: Diagnosis present

## 2017-01-29 DIAGNOSIS — E042 Nontoxic multinodular goiter: Secondary | ICD-10-CM | POA: Diagnosis present

## 2017-01-29 LAB — URINALYSIS, ROUTINE W REFLEX MICROSCOPIC
Bilirubin Urine: NEGATIVE
Glucose, UA: NEGATIVE mg/dL
Ketones, ur: NEGATIVE mg/dL
Nitrite: NEGATIVE
Protein, ur: NEGATIVE mg/dL
Specific Gravity, Urine: 1.011 (ref 1.005–1.030)
pH: 5 (ref 5.0–8.0)

## 2017-01-29 LAB — MAGNESIUM: Magnesium: 1.5 mg/dL — ABNORMAL LOW (ref 1.7–2.4)

## 2017-01-29 LAB — LIPASE, BLOOD: Lipase: 72 U/L — ABNORMAL HIGH (ref 11–51)

## 2017-01-29 LAB — CBC
HCT: 41.5 % (ref 36.0–46.0)
Hemoglobin: 14.7 g/dL (ref 12.0–15.0)
MCH: 36.4 pg — ABNORMAL HIGH (ref 26.0–34.0)
MCHC: 35.4 g/dL (ref 30.0–36.0)
MCV: 102.7 fL — ABNORMAL HIGH (ref 78.0–100.0)
Platelets: 160 10*3/uL (ref 150–400)
RBC: 4.04 MIL/uL (ref 3.87–5.11)
RDW: 14.3 % (ref 11.5–15.5)
WBC: 5.8 10*3/uL (ref 4.0–10.5)

## 2017-01-29 LAB — COMPREHENSIVE METABOLIC PANEL
ALT: 32 U/L (ref 14–54)
AST: 96 U/L — ABNORMAL HIGH (ref 15–41)
Albumin: 5 g/dL (ref 3.5–5.0)
Alkaline Phosphatase: 70 U/L (ref 38–126)
Anion gap: 14 (ref 5–15)
BUN: 8 mg/dL (ref 6–20)
CO2: 30 mmol/L (ref 22–32)
Calcium: 10.4 mg/dL — ABNORMAL HIGH (ref 8.9–10.3)
Chloride: 82 mmol/L — ABNORMAL LOW (ref 101–111)
Creatinine, Ser: 1.03 mg/dL — ABNORMAL HIGH (ref 0.44–1.00)
GFR calc Af Amer: >60 mL/min (ref >60)
GFR calc non Af Amer: >60 mL/min (ref >60)
Glucose, Bld: 95 mg/dL (ref 65–99)
Potassium: 2.4 mmol/L — CL (ref 3.5–5.1)
Sodium: 126 mmol/L — ABNORMAL LOW (ref 135–145)
Total Bilirubin: 1.3 mg/dL — ABNORMAL HIGH (ref 0.3–1.2)
Total Protein: 7.7 g/dL (ref 6.5–8.1)

## 2017-01-29 MED ORDER — MAGNESIUM SULFATE 2 GM/50ML IV SOLN
2.0000 g | Freq: Once | INTRAVENOUS | Status: AC
  Administered 2017-01-29: 2 g via INTRAVENOUS
  Filled 2017-01-29: qty 50

## 2017-01-29 MED ORDER — SODIUM CHLORIDE 0.9 % IV BOLUS (SEPSIS)
1000.0000 mL | Freq: Once | INTRAVENOUS | Status: AC
  Administered 2017-01-29: 1000 mL via INTRAVENOUS

## 2017-01-29 MED ORDER — ENOXAPARIN SODIUM 40 MG/0.4ML ~~LOC~~ SOLN
40.0000 mg | SUBCUTANEOUS | Status: DC
  Administered 2017-01-29: 40 mg via SUBCUTANEOUS
  Filled 2017-01-29: qty 0.4

## 2017-01-29 MED ORDER — ONDANSETRON HCL 4 MG/2ML IJ SOLN
4.0000 mg | Freq: Four times a day (QID) | INTRAMUSCULAR | Status: AC | PRN
  Administered 2017-01-29: 4 mg via INTRAVENOUS
  Filled 2017-01-29: qty 2

## 2017-01-29 MED ORDER — HYDROMORPHONE HCL 1 MG/ML IJ SOLN: 0.5000 mg | INTRAMUSCULAR | Status: DC | PRN

## 2017-01-29 MED ORDER — DEXTROSE 5 % IV SOLN
1.0000 g | INTRAVENOUS | Status: DC
  Administered 2017-01-30: 1 g via INTRAVENOUS
  Filled 2017-01-29 (×2): qty 10

## 2017-01-29 MED ORDER — SODIUM CHLORIDE 0.9 % IV SOLN
INTRAVENOUS | Status: DC
  Administered 2017-01-29 – 2017-01-30 (×3): via INTRAVENOUS

## 2017-01-29 MED ORDER — POTASSIUM CHLORIDE 10 MEQ/100ML IV SOLN
10.0000 meq | INTRAVENOUS | Status: AC
  Administered 2017-01-29 (×6): 10 meq via INTRAVENOUS
  Filled 2017-01-29 (×7): qty 100

## 2017-01-29 MED ORDER — METOPROLOL TARTRATE 25 MG PO TABS
25.0000 mg | ORAL_TABLET | Freq: Two times a day (BID) | ORAL | Status: DC
  Administered 2017-01-29 – 2017-01-30 (×3): 25 mg via ORAL
  Filled 2017-01-29 (×3): qty 1

## 2017-01-29 MED ORDER — IOPAMIDOL (ISOVUE-300) INJECTION 61%
100.0000 mL | Freq: Once | INTRAVENOUS | Status: AC | PRN
  Administered 2017-01-29: 100 mL via INTRAVENOUS

## 2017-01-29 MED ORDER — POTASSIUM CHLORIDE CRYS ER 20 MEQ PO TBCR
40.0000 meq | EXTENDED_RELEASE_TABLET | Freq: Two times a day (BID) | ORAL | Status: AC
  Administered 2017-01-29 (×2): 40 meq via ORAL
  Filled 2017-01-29 (×2): qty 2

## 2017-01-29 MED ORDER — ALPRAZOLAM 0.25 MG PO TABS
0.2500 mg | ORAL_TABLET | Freq: Every evening | ORAL | Status: DC | PRN
  Administered 2017-01-29: 0.25 mg via ORAL
  Filled 2017-01-29: qty 1

## 2017-01-29 MED ORDER — LEVOTHYROXINE SODIUM 75 MCG PO TABS
175.0000 ug | ORAL_TABLET | Freq: Every day | ORAL | Status: DC
  Administered 2017-01-30: 175 ug via ORAL
  Filled 2017-01-29: qty 1

## 2017-01-29 MED ORDER — ONDANSETRON HCL 4 MG/2ML IJ SOLN
4.0000 mg | Freq: Once | INTRAMUSCULAR | Status: AC | PRN
  Administered 2017-01-29: 4 mg via INTRAVENOUS
  Filled 2017-01-29: qty 2

## 2017-01-29 MED ORDER — ACETAMINOPHEN 325 MG PO TABS
650.0000 mg | ORAL_TABLET | Freq: Four times a day (QID) | ORAL | Status: DC | PRN
  Administered 2017-01-29 – 2017-01-30 (×2): 650 mg via ORAL
  Filled 2017-01-29 (×2): qty 2

## 2017-01-29 MED ORDER — CEFTRIAXONE SODIUM 1 G IJ SOLR
1.0000 g | Freq: Once | INTRAMUSCULAR | Status: AC
  Administered 2017-01-29: 1 g via INTRAVENOUS
  Filled 2017-01-29: qty 10

## 2017-01-29 NOTE — ED Triage Notes (Signed)
Patient c/o upper abd pain that started 2 weeks ago and is progressively getting worse. Per patient pain now radiating into right flank. Patient states nausea, vomiting, and diarrhea. Denies any fevers or urinary symptoms.

## 2017-01-29 NOTE — H&P (Signed)
History and Physical    Mallory Smith AVW:[id number] DOB: [birth year] DOA: 01/29/2017  Referring MD/NP/PA: EDP PCP:  Patient coming from: Home  Chief Complaint: abd pain  HPI: Mallory Smith is a 56 y.o. female with medical history significant of HTN, tobacco use, hypothyroidism, presents to the ER with ongoing nausea vomiting and abdominal pain for 7-10 days. Patient reports that she's been having right-sided abdominal pain which is sometimes sharp and sometimes achy, intermittent, nonradiating for at least a week now associated with nausea and vomiting. She denies any fevers or chills she denies any urinary symptoms. Patient also noticed that in the past day or 2 the pain has been radiating to her right lower back. ED Course: Noted to be afebrile, vital signs stable, sodium 126, potassium 2.4 mildly elevated AST and lipase, T bili 1.3, WBC normal, CT abdomen pelvis only notable for mild bladder wall thickening with abnormal UA  Review of Systems: As per HPI otherwise 14 point review of systems negative.   Past Medical History:  Diagnosis Date  . Hypertension   . Thyroid disease     Past Surgical History:  Procedure Laterality Date  . NM THYROID STIM SUPRESS    . TUBAL LIGATION       reports that she has been smoking Cigarettes.  She has a 10.00 pack-year smoking history. She has never used smokeless tobacco. She reports that she drinks alcohol. She reports that she does not use drugs.  No Known Allergies  Family History  Problem Relation Age of Onset  . Diabetes Other   . Stroke Other      Prior to Admission medications   Medication Sig Start Date End Date Taking? Authorizing Provider  acetaminophen (TYLENOL) 500 MG tablet Take 1,000 mg by mouth every 6 (six) hours as needed for pain.    Yes [provider]  ALPRAZolam (XANAX) 0.25 MG tablet Take 0.25 mg by mouth daily. 09/21/14  Yes [provider]  levothyroxine (SYNTHROID, LEVOTHROID) 175 MCG tablet Take  175 mcg by mouth daily. 10/14/14  Yes [provider]  lisinopril-hydrochlorothiazide (PRINZIDE,ZESTORETIC) 10-12.5 MG per tablet Take 1 tablet by mouth daily. 03/17/15  Yes [provider]  metoprolol tartrate (LOPRESSOR) 25 MG tablet Take 1 tablet by mouth 2 (two) times daily. 10/14/14  Yes [provider]    Physical Exam: Vitals:   01/29/17 1245 01/29/17 1300 01/29/17 1315 01/29/17 1330  BP:  (!) 131/91  (!) 148/104  Pulse: 61 (!) 57 (!) 55 (!) 59  Resp: 11 14 (!) 23 15  Temp:      TempSrc:      SpO2: 99% 97% 100% 100%  Weight:      Height:          Constitutional: NAD, calm, comfortable, no distress Vitals:   01/29/17 1245 01/29/17 1300 01/29/17 1315 01/29/17 1330  BP:  (!) 131/91  (!) 148/104  Pulse: 61 (!) 57 (!) 55 (!) 59  Resp: 11 14 (!) 23 15  Temp:      TempSrc:      SpO2: 99% 97% 100% 100%  Weight:      Height:       Eyes: PERRL, lids and conjunctivae normal ENMT: Oral mucosa moist and pink Neck: normal, supple Respiratory: clear to auscultation bilaterally, Cardiovascular: Regular rate and rhythm, no murmurs Abdomen: Soft, obese, mild right upper quadrant tenderness, Murphy's sign negative, no rigidity or rebound Musculoskeletal: no clubbing / cyanosis. Skin: no rashes, lesions, ulcers. No  induration Neurologic: CN 2-12 grossly intact. Sensation intact, DTR normal. Strength 5/5 in all 4.  Psychiatric: Normal judgment and insight. Alert and oriented x 3. Normal mood.   Labs on Admission: I have personally reviewed following labs and imaging studies  CBC:  Recent Labs Lab 01/29/17 0859  WBC 5.8  HGB 14.7  HCT 41.5  MCV 102.7*  PLT 160   Basic Metabolic Panel:  Recent Labs Lab 01/29/17 0859 01/29/17 0900  NA 126*  --   K 2.4*  --   CL 82*  --   CO2 30  --   GLUCOSE 95  --   BUN 8  --   CREATININE 1.03*  --   CALCIUM 10.4*  --   MG  --  1.5*   GFR: Estimated Creatinine Clearance: 65.3 mL/min (A) (by C-G formula  based on SCr of 1.03 mg/dL (H)). Liver Function Tests:  Recent Labs Lab 01/29/17 0859  AST 96*  ALT 32  ALKPHOS 70  BILITOT 1.3*  PROT 7.7  ALBUMIN 5.0    Recent Labs Lab 01/29/17 0859  LIPASE 72*   No results for input(s): AMMONIA in the last 168 hours. Coagulation Profile: No results for input(s): INR, PROTIME in the last 168 hours. Cardiac Enzymes: No results for input(s): CKTOTAL, CKMB, CKMBINDEX, TROPONINI in the last 168 hours. BNP (last 3 results) No results for input(s): PROBNP in the last 8760 hours. HbA1C: No results for input(s): HGBA1C in the last 72 hours. CBG: No results for input(s): GLUCAP in the last 168 hours. Lipid Profile: No results for input(s): CHOL, HDL, LDLCALC, TRIG, CHOLHDL, LDLDIRECT in the last 72 hours. Thyroid Function Tests: No results for input(s): TSH, T4TOTAL, FREET4, T3FREE, THYROIDAB in the last 72 hours. Anemia Panel: No results for input(s): VITAMINB12, FOLATE, FERRITIN, TIBC, IRON, RETICCTPCT in the last 72 hours. Urine analysis:    Component Value Date/Time   COLORURINE YELLOW 01/29/2017 0859   APPEARANCEUR HAZY (A) 01/29/2017 0859   LABSPEC 1.011 01/29/2017 0859   PHURINE 5.0 01/29/2017 0859   GLUCOSEU NEGATIVE 01/29/2017 0859   GLUCOSEU NEGATIVE 04/03/2009 1000   HGBUR SMALL (A) 01/29/2017 0859   BILIRUBINUR NEGATIVE 01/29/2017 0859   KETONESUR NEGATIVE 01/29/2017 0859   PROTEINUR NEGATIVE 01/29/2017 0859   UROBILINOGEN 0.2 10/12/2012 0938   NITRITE NEGATIVE 01/29/2017 0859   LEUKOCYTESUR LARGE (A) 01/29/2017 0859   Sepsis Labs: @LABRCNTIP (procalcitonin:4,lacticidven:4) )No results found for this or any previous visit (from the past 240 hour(s)).   Radiological Exams on Admission: Ct Abdomen Pelvis W Contrast  Result Date: 01/29/2017 CLINICAL DATA:  56 year old female with a history of upper abdominal pain starting 2 weeks ago EXAM: CT ABDOMEN AND PELVIS WITH CONTRAST TECHNIQUE: Multidetector CT imaging of the  abdomen and pelvis was performed using the standard protocol following bolus administration of intravenous contrast. CONTRAST:  100mL ISOVUE-300 IOPAMIDOL (ISOVUE-300) INJECTION 61% COMPARISON:  12/11/2006 FINDINGS: Lower chest: No acute abnormality. Hepatobiliary: No focal liver abnormality is seen. No gallstones, gallbladder wall thickening, or biliary dilatation. Pancreas: Unremarkable. No pancreatic ductal dilatation or surrounding inflammatory changes. Spleen: Normal in size without focal abnormality. Adrenals/Urinary Tract: Unremarkable appearance bilateral adrenal glands. Unremarkable right kidney without hydronephrosis or nephrolithiasis. Perfusion symmetric to the right with no evidence of edema or stranding. Unremarkable course the right ureter. Unremarkable appearance the left kidney without hydronephrosis or nephrolithiasis. Likely Bosniak 1 cyst on the lateral cortex. No stranding or inflammatory changes. Unremarkable course of the left kidney. Compare to the prior CT there is  circumferential urinary bladder wall thickening. No radiopaque stones within the lumen. Stomach/Bowel: Hiatal hernia. Unremarkable appearance the stomach. Unremarkable small bowel with no transition point or abnormal distention. Normal appendix. Colonic diverticula throughout the length of the colon without associated inflammatory changes. Vascular/Lymphatic: Mild soft plaque and calcified plaque of the abdominal aorta. No aneurysm. No dissection. Calcifications extend of the bilateral iliac system. Proximal femoral vasculature patent. Small lymph nodes in the preaortic and para-aortic nodal stations not enlarged. Incidental note made of accessory left renal vein draining to the proximal left common iliac vein. Reproductive: Fibroid uterus. Unremarkable appearance of the adnexa. Other: Fat containing umbilical hernia. Musculoskeletal: Minimal degenerative changes of the hips. No displaced fracture. Degenerative changes of the  spine. No bony canal narrowing. IMPRESSION: Circumferential urinary bladder wall thickening which is new compared to the CT of 2008. This can be seen in setting of inflammatory/ infectious cystitis, and correlation with urinary symptoms and urinalysis may be useful. There is no CT evidence of pyelonephritis. Small hiatal hernia. 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 Aortic Atherosclerosis (ICD10-I70.0). Electronically Signed   By: Gilmer Mor D.O.   On: 01/29/2017 11:08    EKG: I Independently reviewed EKG, no acute ST T wave changes  Assessment/Plan  1. Abdominal pain, nausea, vomiting -CT abdomen pelvis with contrast on the remarkable for mild bladder wall thickening, no cholelithiasis or gallbladder wall thickening noted on this scan -UA is abnormal however patient does not have any symptoms of a urinary tract infection -Her AST and total bili mildly elevated, will check right upper quadrant ultrasound since 20% gallstones can be missed on CT abdomen -LFTs, lipase in a.m. -Will empirically start her on IV ceftriaxone given abnormal urinalysis, follow-up urine culture -Gentle IV fluids, supportive care  2.GOITER, MULTINODULAR -Continue Synthroid  3. Hypokalemia -Due to vomiting/GI losses Replaced IV and by mouth  4. Essential hypertension -Stable, hold lisinopril HCTZ at this time  5. Hyponatremia -Secondary to dehydration and HCTZ -Hydrate, monitor  DVT prophylaxis: lovenox Code Status: Full Code Family Communication: daughter at bedside  Disposition Plan: home in 1-2days Consults called: None  Admission status: inpatient  Zannie Cove MD [facility] Pager [phone]  If 7PM-7AM, please contact night-coverage [email or web address] Password [email or web address]  01/29/2017, 1:40 PM

## 2017-01-29 NOTE — ED Notes (Addendum)
CRITICAL VALUE ALERT   Critical Value:  Potassium 2.4  Date & Time Notied:  01/29/2017 1019  Provider Notified:  Dr. Jodi MourningZavitz  Orders Received/Actions taken: No action is taken at this time.

## 2017-01-29 NOTE — ED Provider Notes (Signed)
[facility] DEPT Provider Note   CSN: [id number] Arrival date & time: 01/29/17  0847     History   Chief Complaint Chief Complaint  Patient presents with  . Abdominal Pain    HPI Mallory Smith is a 56 y.o. female.  HPI   56 year old female with PMH of HTN and hypothyroidism presents with abdominal pain x 2 weeks. Pain is located mostly in the RUQ. Describes pain as sometimes being sharp/stabbing and sometimes being a duller ache. Pain is intermittent but has been increasing in frequency and severity. Has not noticed anything that makes pain worse or a relationship to bowel movements. Has had associated nausea and vomiting for the past 2 weeks and therefore has had decreased PO intake. Started having diarrhea over the past 3-4 days. This morning, pain began to radiate to the right lower back and flank. Denies history of similar abdominal pain. Denies history of nephrolithiasis.   Past Medical History:  Diagnosis Date  . Hypertension   . Thyroid disease     Patient Active Problem List   Diagnosis Date Noted  . HYPOTHYROIDISM, POST-RADIOACTIVE IODINE 04/03/2009  . FATIGUE 04/03/2009  . GOITER, MULTINODULAR 11/28/2008  . 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 GERD 05/30/2007  . HYPERTENSION 03/28/2007    Past Surgical History:  Procedure Laterality Date  . NM THYROID STIM SUPRESS    . TUBAL LIGATION      OB History    Gravida Para Term Preterm AB Living   3 3 3     3    SAB TAB Ectopic Multiple Live Births                   Home Medications    Prior to Admission medications   Medication Sig Start Date End Date Taking? Authorizing Provider  acetaminophen (TYLENOL) 500 MG tablet Take 1,000 mg by mouth every 6 (six) hours as needed for pain.    Yes [provider]  ALPRAZolam (XANAX) 0.25 MG tablet Take 0.25 mg by mouth daily. 09/21/14  Yes [provider]  levothyroxine (SYNTHROID, LEVOTHROID) 175 MCG tablet Take 175 mcg by mouth daily.  10/14/14  Yes [provider]  lisinopril-hydrochlorothiazide (PRINZIDE,ZESTORETIC) 10-12.5 MG per tablet Take 1 tablet by mouth daily. 03/17/15  Yes [provider]  metoprolol tartrate (LOPRESSOR) 25 MG tablet Take 1 tablet by mouth 2 (two) times daily. 10/14/14  Yes [provider]    Family History Family History  Problem Relation Age of Onset  . Diabetes Other   . Stroke Other     Social History Social History  Substance Use Topics  . Smoking status: Current Every Day Smoker    Packs/day: 0.50    Years: 20.00    Types: Cigarettes  . Smokeless tobacco: Never Used  . Alcohol use Yes     Comment: occ     Allergies   Patient has no known allergies.   Review of Systems Review of Systems  Constitutional: Positive for appetite change. Negative for chills and fever.  HENT: Negative for congestion and rhinorrhea.   Respiratory: Negative for shortness of breath.   Cardiovascular: Negative for chest pain.  Gastrointestinal: Positive for abdominal pain, diarrhea, nausea and vomiting. Negative for abdominal distention, blood in stool and constipation.  Genitourinary: Negative for difficulty urinating, dysuria, hematuria, urgency and vaginal discharge.  Musculoskeletal: Positive for back pain.  Neurological: Negative for headaches.  Psychiatric/Behavioral: Negative for confusion.     Physical Exam Updated Vital  Signs BP (!) 141/109 (BP Location: Right Arm)   Pulse 60   Temp 97.7 F (36.5 C) (Oral)   Resp 14   Ht 5\' 9"  (1.753 m)   Wt 77.1 kg (170 lb)   SpO2 100%   BMI 25.10 kg/m   Physical Exam  Constitutional: She is oriented to person, place, and time. She appears well-developed and well-nourished. No distress.  HENT:  Head: Normocephalic and atraumatic.  Lips dry. MMM.   Eyes: Conjunctivae and EOM are normal. Pupils are equal, round, and reactive to light.  Neck: Normal range of motion. Neck supple.  Cardiovascular: Normal rate, regular  rhythm and normal heart sounds.   No murmur heard. Pulmonary/Chest: Effort normal and breath sounds normal. No respiratory distress. She has no wheezes.  Abdominal: Soft. Bowel sounds are normal. She exhibits no distension. There is no rebound.  Diffuse tenderness but most significant in RUQ with voluntary guarding of that area present. Questionably positive Murphy's sign.   Neurological: She is alert and oriented to person, place, and time. She exhibits normal muscle tone.  Skin: Skin is warm and dry.  Psychiatric: She has a normal mood and affect. Her behavior is normal.     ED Treatments / Results  Labs (all labs ordered are listed, but only abnormal results are displayed) Labs Reviewed  LIPASE, BLOOD - Abnormal; Notable for the following:       Result Value   Lipase 72 (*)    All other components within normal limits  COMPREHENSIVE METABOLIC PANEL - Abnormal; Notable for the following:    Sodium 126 (*)    Potassium 2.4 (*)    Chloride 82 (*)    Creatinine, Ser 1.03 (*)    Calcium 10.4 (*)    AST 96 (*)    Total Bilirubin 1.3 (*)    All other components within normal limits  CBC - Abnormal; Notable for the following:    MCV 102.7 (*)    MCH 36.4 (*)    All other components within normal limits  URINALYSIS, ROUTINE W REFLEX MICROSCOPIC - Abnormal; Notable for the following:    APPearance HAZY (*)    Hgb urine dipstick SMALL (*)    Leukocytes, UA LARGE (*)    Bacteria, UA MANY (*)    Squamous Epithelial / LPF 6-30 (*)    All other components within normal limits  MAGNESIUM - Abnormal; Notable for the following:    Magnesium 1.5 (*)    All other components within normal limits    EKG  EKG Interpretation None       Radiology Ct Abdomen Pelvis W Contrast  Result Date: 01/29/2017 CLINICAL DATA:  56 year old female with a history of upper abdominal pain starting 2 weeks ago EXAM: CT ABDOMEN AND PELVIS WITH CONTRAST TECHNIQUE: Multidetector CT imaging of the abdomen  and pelvis was performed using the standard protocol following bolus administration of intravenous contrast. CONTRAST:  100mL ISOVUE-300 IOPAMIDOL (ISOVUE-300) INJECTION 61% COMPARISON:  12/11/2006 FINDINGS: Lower chest: No acute abnormality. Hepatobiliary: No focal liver abnormality is seen. No gallstones, gallbladder wall thickening, or biliary dilatation. Pancreas: Unremarkable. No pancreatic ductal dilatation or surrounding inflammatory changes. Spleen: Normal in size without focal abnormality. Adrenals/Urinary Tract: Unremarkable appearance bilateral adrenal glands. Unremarkable right kidney without hydronephrosis or nephrolithiasis. Perfusion symmetric to the right with no evidence of edema or stranding. Unremarkable course the right ureter. Unremarkable appearance the left kidney without hydronephrosis or nephrolithiasis. Likely Bosniak 1 cyst on the lateral cortex. No stranding  or inflammatory changes. Unremarkable course of the left kidney. Compare to the prior CT there is circumferential urinary bladder wall thickening. No radiopaque stones within the lumen. Stomach/Bowel: Hiatal hernia. Unremarkable appearance the stomach. Unremarkable small bowel with no transition point or abnormal distention. Normal appendix. Colonic diverticula throughout the length of the colon without associated inflammatory changes. Vascular/Lymphatic: Mild soft plaque and calcified plaque of the abdominal aorta. No aneurysm. No dissection. Calcifications extend of the bilateral iliac system. Proximal femoral vasculature patent. Small lymph nodes in the preaortic and para-aortic nodal stations not enlarged. Incidental note made of accessory left renal vein draining to the proximal left common iliac vein. Reproductive: Fibroid uterus. Unremarkable appearance of the adnexa. Other: Fat containing umbilical hernia. Musculoskeletal: Minimal degenerative changes of the hips. No displaced fracture. Degenerative changes of the spine. No  bony canal narrowing. IMPRESSION: Circumferential urinary bladder wall thickening which is new compared to the CT of 2008. This can be seen in setting of inflammatory/ infectious cystitis, and correlation with urinary symptoms and urinalysis may be useful. There is no CT evidence of pyelonephritis. Small hiatal hernia. 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 Aortic Atherosclerosis (ICD10-I70.0). Electronically Signed   By: Gilmer Mor D.O.   On: 01/29/2017 11:08    Procedures Procedures (including critical care time)  Medications Ordered in ED Medications  sodium chloride 0.9 % bolus 1,000 mL (1,000 mLs Intravenous New Bag/Given 01/29/17 1103)  potassium chloride 10 mEq in 100 mL IVPB (10 mEq Intravenous New Bag/Given 01/29/17 1104)  cefTRIAXone (ROCEPHIN) 1 g in dextrose 5 % 50 mL IVPB (1 g Intravenous New Bag/Given 01/29/17 1124)  magnesium sulfate IVPB 2 g 50 mL (not administered)  ondansetron (ZOFRAN) injection 4 mg (4 mg Intravenous Given 01/29/17 0926)  iopamidol (ISOVUE-300) 61 % injection 100 mL (100 mLs Intravenous Contrast Given 01/29/17 1035)     Initial Impression / Assessment and Plan / ED Course  I have reviewed the triage vital signs and the nursing notes.  Pertinent labs & imaging results that were available during my care of the patient were reviewed by me and considered in my medical decision making (see chart for details).     56 year old female presenting with abdominal pain, worse in RUQ. Patient afebrile and hemodynamically stable. Bedside RUQ ultrasound performed to evaluate for gallbladder etiology of pain. Sludge was visualized without stones, gallbladder wall thickening or surrounding edema. Ordered CT abdomen/pelvis to further evaluate gallbladder as well as other possible etiologies of pain. UA concerning for infectious process. CT abdomen revealed circumferential urinary bladder wall thickening; new finding when compared to prior imaging.  Finding consistent with inflammatory/infectious cystitis. Rocephin given in ED. No evidence of pyelonephritis on imaging. 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    CBC unremarkable. Lipase minimally elevated to 72. Pancreas unremarkable on imaging. CMET remarkable for hypokalemia and hyponatremia likely due to history of recurrent emesis. 1L NS bolus given and IV K given. Magnesium low as well; repletion completed. EKG without significant findings.   Given inability to tolerate PO intake with electrolyte marked electrolye abnormalities and active infectious process, patient will require admission for correction of electrolyte derangements and IV antibiotics. Discussed with TRH who agrees to admission.   Final Clinical Impressions(s) / ED Diagnoses   Final diagnoses:  Cystitis  Hypokalemia  Hyponatremia    New Prescriptions New Prescriptions   No medications on file     Arvilla Market, DO 01/29/17 1132    Arvilla Market, DO  01/29/17 1140    Blane Ohara, MD 01/29/17 1517    Blane Ohara, MD 01/31/17 [phone]

## 2017-01-29 NOTE — ED Notes (Signed)
Pt returned from CT °

## 2017-01-30 ENCOUNTER — Inpatient Hospital Stay (HOSPITAL_COMMUNITY): Payer: Self-pay

## 2017-01-30 DIAGNOSIS — N3090 Cystitis, unspecified without hematuria: Secondary | ICD-10-CM

## 2017-01-30 LAB — CBC
HCT: 35.1 % — ABNORMAL LOW (ref 36.0–46.0)
Hemoglobin: 12.3 g/dL (ref 12.0–15.0)
MCH: 36.4 pg — ABNORMAL HIGH (ref 26.0–34.0)
MCHC: 35 g/dL (ref 30.0–36.0)
MCV: 103.8 fL — ABNORMAL HIGH (ref 78.0–100.0)
Platelets: 129 10*3/uL — ABNORMAL LOW (ref 150–400)
RBC: 3.38 MIL/uL — ABNORMAL LOW (ref 3.87–5.11)
RDW: 14.4 % (ref 11.5–15.5)
WBC: 3.3 10*3/uL — ABNORMAL LOW (ref 4.0–10.5)

## 2017-01-30 LAB — COMPREHENSIVE METABOLIC PANEL
ALT: 24 U/L (ref 14–54)
AST: 56 U/L — ABNORMAL HIGH (ref 15–41)
Albumin: 3.7 g/dL (ref 3.5–5.0)
Alkaline Phosphatase: 51 U/L (ref 38–126)
Anion gap: 8 (ref 5–15)
BUN: 6 mg/dL (ref 6–20)
CO2: 28 mmol/L (ref 22–32)
Calcium: 8.4 mg/dL — ABNORMAL LOW (ref 8.9–10.3)
Chloride: 93 mmol/L — ABNORMAL LOW (ref 101–111)
Creatinine, Ser: 0.85 mg/dL (ref 0.44–1.00)
GFR calc Af Amer: >60 mL/min (ref >60)
GFR calc non Af Amer: >60 mL/min (ref >60)
Glucose, Bld: 82 mg/dL (ref 65–99)
Potassium: 3.7 mmol/L (ref 3.5–5.1)
Sodium: 129 mmol/L — ABNORMAL LOW (ref 135–145)
Total Bilirubin: 1 mg/dL (ref 0.3–1.2)
Total Protein: 5.9 g/dL — ABNORMAL LOW (ref 6.5–8.1)

## 2017-01-30 LAB — HIV ANTIBODY (ROUTINE TESTING W REFLEX): HIV Screen 4th Generation wRfx: NONREACTIVE

## 2017-01-30 MED ORDER — CIPROFLOXACIN HCL 500 MG PO TABS: 500.0000 mg | ORAL_TABLET | Freq: Two times a day (BID) | ORAL | 0 refills | Status: AC

## 2017-01-30 NOTE — Discharge Summary (Signed)
Physician Discharge Summary  Mallory Smith KVQ:[id number] DOB: [birth year] DOA: 01/29/2017  PCP: Benita StabileHall, John Z, MD  Admit date: 01/29/2017 Discharge date: 01/30/2017  Time spent: 45 minutes  Recommendations for Outpatient Follow-up:  -Will be discharged home today. -Advised to follow up with PCP in 2 weeks.   Discharge Diagnoses:  Active Problems:   GOITER, MULTINODULAR   HYPOTHYROIDISM, POST-RADIOACTIVE IODINE   Essential hypertension   Abdominal pain   Discharge Condition: Stable and improved  Filed Weights   01/29/17 0857  Weight: 77.1 kg (170 lb)    History of present illness:   Hospital Course:  As per Dr. Jomarie LongsJoseph on 6/16: Mallory Smith is a 56 y.o. female with medical history significant of HTN, tobacco use, hypothyroidism, presents to the ER with ongoing nausea vomiting and abdominal pain for 7-10 days. Patient reports that she's been having right-sided abdominal pain which is sometimes sharp and sometimes achy, intermittent, nonradiating for at least a week now associated with nausea and vomiting. She denies any fevers or chills she denies any urinary symptoms. Patient also noticed that in the past day or 2 the pain has been radiating to her right lower back. ED Course: Noted to be afebrile, vital signs stable, sodium 126, potassium 2.4 mildly elevated AST and lipase, T bili 1.3, WBC normal, CT abdomen pelvis only notable for mild bladder wall thickening with abnormal UA  Abd pain/nausea/vomiting -Likely due to UTI. -Cx are pending at time of DC. -All symptoms have been resolved. -Will DC on cipro for 5 days.  Rest of chronic conditions have been stable   Procedures:  None   Consultations:  None  Discharge Instructions  Discharge Instructions    Diet - low sodium heart healthy    Complete by:  As directed    Increase activity slowly    Complete by:  As directed      Allergies as of 01/30/2017   No Known Allergies     Medication List    TAKE  these medications   acetaminophen 500 MG tablet Commonly known as:  TYLENOL Take 1,000 mg by mouth every 6 (six) hours as needed for pain.   ALPRAZolam 0.25 MG tablet Commonly known as:  XANAX Take 0.25 mg by mouth daily.   ciprofloxacin 500 MG tablet Commonly known as:  CIPRO Take 1 tablet (500 mg total) by mouth 2 (two) times daily.   levothyroxine 175 MCG tablet Commonly known as:  SYNTHROID, LEVOTHROID Take 175 mcg by mouth daily.   lisinopril-hydrochlorothiazide 10-12.5 MG tablet Commonly known as:  PRINZIDE,ZESTORETIC Take 1 tablet by mouth daily.   metoprolol tartrate 25 MG tablet Commonly known as:  LOPRESSOR Take 1 tablet by mouth 2 (two) times daily.      No Known Allergies Follow-up Information    Benita StabileHall, John Z, MD. Schedule an appointment as soon as possible for a visit in 2 week(s).   Specialty:  Internal Medicine Contact information: [address] [place] [address] [phone]            The results of significant diagnostics from this hospitalization (including imaging, microbiology, ancillary and laboratory) are listed below for reference.    Significant Diagnostic Studies: Ct Abdomen Pelvis W Contrast  Result Date: 01/29/2017 CLINICAL DATA:  56 year old female with a history of upper abdominal pain starting 2 weeks ago EXAM: CT ABDOMEN AND PELVIS WITH CONTRAST TECHNIQUE: Multidetector CT imaging of the abdomen and pelvis was performed using the standard protocol following bolus  administration of intravenous contrast. CONTRAST:  ISOVUE-300 IOPAMIDOL (ISOVUE-300) INJECTION 61% COMPARISON:  12/11/2006 FINDINGS: Lower chest: No acute abnormality. Hepatobiliary: No focal liver abnormality is seen. No gallstones, gallbladder wall thickening, or biliary dilatation. Pancreas: Unremarkable. No pancreatic ductal dilatation or surrounding inflammatory changes. Spleen: Normal in size without focal abnormality. Adrenals/Urinary Tract: Unremarkable  appearance bilateral adrenal glands. Unremarkable right kidney without hydronephrosis or nephrolithiasis. Perfusion symmetric to the right with no evidence of edema or stranding. Unremarkable course the right ureter. Unremarkable appearance the left kidney without hydronephrosis or nephrolithiasis. Likely Bosniak 1 cyst on the lateral cortex. No stranding or inflammatory changes. Unremarkable course of the left kidney. Compare to the prior CT there is circumferential urinary bladder wall thickening. No radiopaque stones within the lumen. Stomach/Bowel: Hiatal hernia. Unremarkable appearance the stomach. Unremarkable small bowel with no transition point or abnormal distention. Normal appendix. Colonic diverticula throughout the length of the colon without associated inflammatory changes. Vascular/Lymphatic: Mild soft plaque and calcified plaque of the abdominal aorta. No aneurysm. No dissection. Calcifications extend of the bilateral iliac system. Proximal femoral vasculature patent. Small lymph nodes in the preaortic and para-aortic nodal stations not enlarged. Incidental note made of accessory left renal vein draining to the proximal left common iliac vein. Reproductive: Fibroid uterus. Unremarkable appearance of the adnexa. Other: Fat containing umbilical hernia. Musculoskeletal: Minimal degenerative changes of the hips. No displaced fracture. Degenerative changes of the spine. No bony canal narrowing. IMPRESSION: Circumferential urinary bladder wall thickening which is new compared to the CT of 2008. This can be seen in setting of inflammatory/ infectious cystitis, and correlation with urinary symptoms and urinalysis may be useful. There is no CT evidence of pyelonephritis. Small hiatal hernia. 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 Aortic Atherosclerosis (ICD10-I70.0). Electronically Signed   By: Gilmer Mor D.O.   On: 01/29/2017 11:08    Microbiology: No results found for this or  any previous visit (from the past 240 hour(s)).   Labs: Basic Metabolic Panel:  Recent Labs Lab 01/29/17 0859 01/29/17 0900 01/30/17 0516  NA 126*  --  129*  K 2.4*  --  3.7  CL 82*  --  93*  CO2 30  --  28  GLUCOSE 95  --  82  BUN 8  --  6  CREATININE 1.03*  --  0.85  CALCIUM 10.4*  --  8.4*  MG  --  1.5*  --    Liver Function Tests:  Recent Labs Lab 01/29/17 0859 01/30/17 0516  AST 96* 56*  ALT 32 24  ALKPHOS 70 51  BILITOT 1.3* 1.0  PROT 7.7 5.9*  ALBUMIN 5.0 3.7    Recent Labs Lab 01/29/17 0859  LIPASE 72*   No results for input(s): AMMONIA in the last 168 hours. CBC:  Recent Labs Lab 01/29/17 0859 01/30/17 0516  WBC 5.8 3.3*  HGB 14.7 12.3  HCT 41.5 35.1*  MCV 102.7* 103.8*  PLT 160 129*   Cardiac Enzymes: No results for input(s): CKTOTAL, CKMB, CKMBINDEX, TROPONINI in the last 168 hours. BNP: BNP (last 3 results) No results for input(s): BNP in the last 8760 hours.  ProBNP (last 3 results) No results for input(s): PROBNP in the last 8760 hours.  CBG: No results for input(s): GLUCAP in the last 168 hours.     SignedChaya Jan  [facility] Pager: [phone] 01/30/2017, 1:06 PM

## 2017-01-30 NOTE — Progress Notes (Signed)
Pt discharged home today per Dr. Hernandez.  Pt's IV site D/C'd and WDL.  Pt's VSS.  Pt provided with home medication list, discharge instructions and prescriptions.  Verbalized understanding.  Pt ambulated off floor in stable condition accompanied by RN. 

## 2017-02-02 LAB — URINE CULTURE: Culture: >=100000 — AB

## 2017-07-31 ENCOUNTER — Emergency Department (HOSPITAL_COMMUNITY)
Admission: EM | Admit: 2017-07-31 | Discharge: 2017-07-31 | Disposition: A | Discharge status: Home / Self Care | Payer: Self-pay | Attending: Emergency Medicine | Admitting: Emergency Medicine

## 2017-07-31 ENCOUNTER — Other Ambulatory Visit: Payer: Self-pay

## 2017-07-31 ENCOUNTER — Encounter (HOSPITAL_COMMUNITY): Payer: Self-pay | Admitting: *Deleted

## 2017-07-31 ENCOUNTER — Emergency Department (HOSPITAL_COMMUNITY): Payer: Self-pay

## 2017-07-31 DIAGNOSIS — N6320 Unspecified lump in the left breast, unspecified quadrant: Secondary | ICD-10-CM | POA: Insufficient documentation

## 2017-07-31 DIAGNOSIS — M545 Low back pain, unspecified: Secondary | ICD-10-CM

## 2017-07-31 DIAGNOSIS — N63 Unspecified lump in unspecified breast: Secondary | ICD-10-CM

## 2017-07-31 DIAGNOSIS — E039 Hypothyroidism, unspecified: Secondary | ICD-10-CM | POA: Insufficient documentation

## 2017-07-31 DIAGNOSIS — I10 Essential (primary) hypertension: Secondary | ICD-10-CM | POA: Insufficient documentation

## 2017-07-31 DIAGNOSIS — Z79899 Other long term (current) drug therapy: Secondary | ICD-10-CM | POA: Insufficient documentation

## 2017-07-31 DIAGNOSIS — F17210 Nicotine dependence, cigarettes, uncomplicated: Secondary | ICD-10-CM | POA: Insufficient documentation

## 2017-07-31 MED ORDER — CYCLOBENZAPRINE HCL 10 MG PO TABS: 10.0000 mg | ORAL_TABLET | Freq: Two times a day (BID) | ORAL | 0 refills | Status: DC | PRN

## 2017-07-31 MED ORDER — IBUPROFEN 600 MG PO TABS: 600.0000 mg | ORAL_TABLET | Freq: Four times a day (QID) | ORAL | 0 refills | Status: DC | PRN

## 2017-07-31 MED ORDER — IBUPROFEN 400 MG PO TABS
600.0000 mg | ORAL_TABLET | Freq: Once | ORAL | Status: AC
  Administered 2017-07-31: 600 mg via ORAL
  Filled 2017-07-31: qty 2

## 2017-07-31 NOTE — ED Notes (Signed)
Family at bedside. 

## 2017-07-31 NOTE — ED Triage Notes (Signed)
Pt is now c/o having thougths of harming herself.

## 2017-07-31 NOTE — ED Notes (Signed)
Pt stated to ED Provider there is a lump on her breast and requested it be examined. RN and MD and bedside and completed quick breast exam and lump on left breast is noted.

## 2017-07-31 NOTE — ED Notes (Signed)
ED Provider at bedside. 
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
## 2017-07-31 NOTE — Discharge Instructions (Signed)
Schedule an appointment to see Dr. Margo AyeHall this week regarding the breast mass.  You will need additional testing such as a mammogram and possibly a biopsy.  Take the medications as needed for your back pain.  Consider contacting one of the centers listed in the discharge instructions regarding your depression.

## 2017-07-31 NOTE — ED Notes (Signed)
Patient changed into hospital behavior health scrubs, and belongings secured in locker.
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
## 2017-08-25 ENCOUNTER — Other Ambulatory Visit: Payer: Self-pay | Admitting: Internal Medicine

## 2017-08-25 DIAGNOSIS — N6452 Nipple discharge: Secondary | ICD-10-CM

## 2017-08-25 DIAGNOSIS — N6320 Unspecified lump in the left breast, unspecified quadrant: Secondary | ICD-10-CM

## 2017-08-30 ENCOUNTER — Ambulatory Visit
Admission: RE | Admit: 2017-08-30 | Discharge: 2017-08-30 | Disposition: A | Discharge status: Home / Self Care | Payer: BLUE CROSS/BLUE SHIELD | Source: Ambulatory Visit | Attending: Internal Medicine | Admitting: Internal Medicine

## 2017-08-30 ENCOUNTER — Other Ambulatory Visit: Payer: Self-pay | Admitting: Internal Medicine

## 2017-08-30 DIAGNOSIS — N6452 Nipple discharge: Secondary | ICD-10-CM

## 2017-08-30 DIAGNOSIS — N6320 Unspecified lump in the left breast, unspecified quadrant: Secondary | ICD-10-CM

## 2017-08-30 DIAGNOSIS — R921 Mammographic calcification found on diagnostic imaging of breast: Secondary | ICD-10-CM

## 2017-08-30 DIAGNOSIS — N6489 Other specified disorders of breast: Secondary | ICD-10-CM

## 2017-09-07 ENCOUNTER — Other Ambulatory Visit: Payer: Self-pay | Admitting: Internal Medicine

## 2017-09-07 ENCOUNTER — Ambulatory Visit
Admission: RE | Admit: 2017-09-07 | Discharge: 2017-09-07 | Disposition: A | Discharge status: Home / Self Care | Payer: BLUE CROSS/BLUE SHIELD | Source: Ambulatory Visit | Attending: Internal Medicine | Admitting: Internal Medicine

## 2017-09-07 ENCOUNTER — Other Ambulatory Visit (HOSPITAL_COMMUNITY)
Admission: RE | Admit: 2017-09-07 | Discharge: 2017-09-07 | Disposition: A | Discharge status: Home / Self Care | Payer: BLUE CROSS/BLUE SHIELD | Source: Ambulatory Visit | Attending: Diagnostic Radiology | Admitting: Diagnostic Radiology

## 2017-09-07 DIAGNOSIS — N6320 Unspecified lump in the left breast, unspecified quadrant: Secondary | ICD-10-CM | POA: Diagnosis present

## 2017-09-07 DIAGNOSIS — N6489 Other specified disorders of breast: Secondary | ICD-10-CM

## 2017-09-07 DIAGNOSIS — R921 Mammographic calcification found on diagnostic imaging of breast: Secondary | ICD-10-CM

## 2017-09-12 LAB — AEROBIC/ANAEROBIC CULTURE W GRAM STAIN (SURGICAL/DEEP WOUND)

## 2017-09-21 ENCOUNTER — Ambulatory Visit: Payer: Self-pay | Admitting: Surgery

## 2017-09-21 DIAGNOSIS — N6320 Unspecified lump in the left breast, unspecified quadrant: Secondary | ICD-10-CM

## 2017-09-23 ENCOUNTER — Other Ambulatory Visit: Payer: Self-pay | Admitting: Surgery

## 2017-09-23 DIAGNOSIS — N6320 Unspecified lump in the left breast, unspecified quadrant: Secondary | ICD-10-CM

## 2017-10-10 NOTE — Pre-Procedure Instructions (Signed)
Stephan MinisterMary A Loughney  10/10/2017      [id number] - [place], [place] - [address] [place] [id number] Phone: [phone] Fax: [phone]  [facility] - [place], [place] - [address]  [place] [id number] Phone: [phone] Fax: [phone]    Your procedure is scheduled on  Tuesday 10/18/17  [facility] Ad[facility] at 530 A.M.  Call this number if you have problems the morning of surgery:  [phone]   Remember:  Do not eat food or drink liquids after midnight.  Take these medicines the morning of surgery with A SIP OF WATER - LEVOTHYROXINE, METOPROLOL (LOPRESSOR), OMEPRAZOLE (PRILOSEC)  7 days prior to surgery STOP taking any Aspirin(unless otherwise instructed by your surgeon), Aleve, Naproxen, MELOXICAM/ MOBIC, Ibuprofen, Motrin, Advil, Goody's, BC's, all herbal medications, fish oil, and all vitamins, SUDAFED/ PSEUDOEPHEDRINE   Do not wear jewelry, make-up or nail polish.  Do not wear lotions, powders, or perfumes, or deodorant.  Do not shave 48 hours prior to surgery.  Men may shave face and neck.  Do not bring valuables to the hospital.  [facility] is not responsible for any belongings or valuables.  Contacts, dentures or bridgework may not be worn into surgery.  Leave your suitcase in the car.  After surgery it may be brought to your room.  For patients admitted to the hospital, discharge time will be determined by your treatment team.  Patients discharged the day of surgery will not be allowed to drive home.   Name and phone number of your driver:    Special instructions:  [facility] - Preparing for Surgery  Before surgery, you can play an important role.  Because skin is not sterile, your skin needs to be as free of germs as possible.  You can reduce the number of germs on you skin by washing with CHG (chlorahexidine gluconate) soap before surgery.  CHG is an antiseptic cleaner which kills germs and bonds with the  skin to continue killing germs even after washing.  Please DO NOT use if you have an allergy to CHG or antibacterial soaps.  If your skin becomes reddened/irritated stop using the CHG and inform your nurse when you arrive at Short Stay.  Do not shave (including legs and underarms) for at least 48 hours prior to the first CHG shower.  You may shave your face.  Please follow these instructions carefully:   1.  Shower with CHG Soap the night before surgery and the                                morning of Surgery.  2.  If you choose to wash your hair, wash your hair first as usual with your       normal shampoo.  3.  After you shampoo, rinse your hair and body thoroughly to remove the                      Shampoo.  4.  Use CHG as you would any other liquid soap.  You can apply chg directly       to the skin and wash gently with scrungie or a clean washcloth.  5.  Apply the CHG Soap to your body ONLY FROM THE NECK DOWN.        Do not use on open wounds or open sores.  Avoid contact with your eyes,       ears, mouth and genitals (private parts).  Wash genitals (private parts)       with your normal soap.  6.  Wash thoroughly, paying special attention to the area where your surgery        will be performed.  7.  Thoroughly rinse your body with warm water from the neck down.  8.  DO NOT shower/wash with your normal soap after using and rinsing off       the CHG Soap.  9.  Pat yourself dry with a clean towel.            10.  Wear clean pajamas.            11.  Place clean sheets on your bed the night of your first shower and do not        sleep with pets.  Day of Surgery  Do not apply any lotions/deoderants the morning of surgery.  Please wear clean clothes to the hospital/surgery center.    Please read over the following fact sheets that you were given. Pain Booklet

## 2017-10-10 NOTE — Pre-Procedure Instructions (Signed)
Stephan Minister  10/10/2017      [id number] - [place], [place] - [address] [place] [address] Phone: [phone] Fax: [phone]  [facility] - [place], [place] - [address] [place] [address] Phone: [phone] Fax: [phone]    Your procedure is scheduled on  Tuesday, October 18, 2017  Report to [facility] Admitting at 5:30 A.M.  Call this number if you have problems the morning of surgery:  [phone]   Remember:  Do not eat food or drink liquids after midnight.  **Please complete your PRE-SURGERY ENSURE that was provided before you leave your house the morning of surgery.  Please, if able, drink it in one setting. DO NOT SIP.**   Take these medicines the morning of surgery with A SIP OF WATER:  LEVOTHYROXINE (SYNTHROID) METOPROLOL (LOPRESSOR) OMEPRAZOLE (PRILOSEC) FLONASE NASAL SPRAY IF NEEDED  7 days prior to surgery STOP taking any Aspirin (unless otherwise instructed by your surgeon), Aleve, Naproxen, MELOXICAM/ MOBIC, Ibuprofen, Motrin, Advil, Goody's, BC's, all herbal medications, fish oil, and all vitamins, SUDAFED/ PSEUDOEPHEDRINE.   Do not wear jewelry, make-up or nail polish.  Do not wear lotions, powders, or perfumes, or deodorant.  Do not shave 48 hours prior to surgery.    Do not bring valuables to the hospital.  [facility] is not responsible for any belongings or valuables.  Contacts, dentures or bridgework may not be worn into surgery.  Leave your suitcase in the car.  After surgery it may be brought to your room.  For patients admitted to the hospital, discharge time will be determined by your treatment team.  Patients discharged the day of surgery will not be allowed to drive home.   Special instructions:  [facility]- Preparing For Surgery  Before surgery, you can play an important role. Because skin is not sterile, your skin needs to be as free of germs as possible. You can reduce the number  of germs on your skin by washing with CHG (chlorahexidine gluconate) Soap before surgery.  CHG is an antiseptic cleaner which kills germs and bonds with the skin to continue killing germs even after washing.  Please do not use if you have an allergy to CHG or antibacterial soaps. If your skin becomes reddened/irritated stop using the CHG.  Do not shave (including legs and underarms) for at least 48 hours prior to first CHG shower. It is OK to shave your face.  Please follow these instructions carefully.   1. Shower the NIGHT BEFORE SURGERY and the MORNING OF SURGERY with CHG.   2. If you chose to wash your hair, wash your hair first as usual with your normal shampoo.  3. After you shampoo, rinse your hair and body thoroughly to remove the shampoo.  4. Use CHG as you would any other liquid soap. You can apply CHG directly to the skin and wash gently with a scrungie or a clean washcloth.   5. Apply the CHG Soap to your body ONLY FROM THE NECK DOWN.  Do not use on open wounds or open sores. Avoid contact with your eyes, ears, mouth and genitals (private parts). Wash Face and genitals (private parts)  with your normal soap.  6. Wash thoroughly, paying special attention to the area where your surgery will be performed.  7. Thoroughly rinse your body with warm water from the neck down.  8. DO NOT shower/wash with your normal soap after using and rinsing  off the CHG Soap.  9. Pat yourself dry with a CLEAN TOWEL.  10. Wear CLEAN PAJAMAS to bed the night before surgery, wear comfortable clothes the morning of surgery  11. Place CLEAN SHEETS on your bed the night of your first shower and DO NOT SLEEP WITH PETS.    Day of Surgery: Do not apply any deodorants/lotions. Please wear clean clothes to the hospital/surgery center.       Please read over the following fact sheets that you were given.

## 2017-10-11 ENCOUNTER — Encounter (HOSPITAL_COMMUNITY)
Admission: RE | Admit: 2017-10-11 | Discharge: 2017-10-11 | Disposition: A | Discharge status: Home / Self Care | Payer: BLUE CROSS/BLUE SHIELD | Source: Ambulatory Visit | Attending: Surgery | Admitting: Surgery

## 2017-10-11 ENCOUNTER — Other Ambulatory Visit: Payer: Self-pay

## 2017-10-11 ENCOUNTER — Encounter (HOSPITAL_COMMUNITY): Payer: Self-pay

## 2017-10-11 DIAGNOSIS — Z01818 Encounter for other preprocedural examination: Secondary | ICD-10-CM | POA: Diagnosis present

## 2017-10-11 DIAGNOSIS — N6002 Solitary cyst of left breast: Secondary | ICD-10-CM | POA: Insufficient documentation

## 2017-10-11 DIAGNOSIS — N6489 Other specified disorders of breast: Secondary | ICD-10-CM | POA: Insufficient documentation

## 2017-10-11 HISTORY — DX: Thyrotoxicosis, unspecified without thyrotoxic crisis or storm: E05.90

## 2017-10-11 HISTORY — DX: Hypothyroidism, unspecified: E03.9

## 2017-10-11 LAB — BASIC METABOLIC PANEL
Anion gap: 10 (ref 5–15)
BUN: 16 mg/dL (ref 6–20)
CO2: 21 mmol/L — ABNORMAL LOW (ref 22–32)
Calcium: 9.4 mg/dL (ref 8.9–10.3)
Chloride: 104 mmol/L (ref 101–111)
Creatinine, Ser: 1.15 mg/dL — ABNORMAL HIGH (ref 0.44–1.00)
GFR calc Af Amer: >60 mL/min (ref >60)
GFR calc non Af Amer: 53 mL/min — ABNORMAL LOW (ref >60)
Glucose, Bld: 82 mg/dL (ref 65–99)
Potassium: 4.5 mmol/L (ref 3.5–5.1)
Sodium: 135 mmol/L (ref 135–145)

## 2017-10-11 LAB — CBC
HCT: 42.9 % (ref 36.0–46.0)
Hemoglobin: 14 g/dL (ref 12.0–15.0)
MCH: 30.2 pg (ref 26.0–34.0)
MCHC: 32.6 g/dL (ref 30.0–36.0)
MCV: 92.7 fL (ref 78.0–100.0)
Platelets: 136 10*3/uL — ABNORMAL LOW (ref 150–400)
RBC: 4.63 MIL/uL (ref 3.87–5.11)
RDW: 15.3 % (ref 11.5–15.5)
WBC: 7.1 10*3/uL (ref 4.0–10.5)

## 2017-10-11 MED ORDER — CHLORHEXIDINE GLUCONATE CLOTH 2 % EX PADS: 6.0000 | MEDICATED_PAD | Freq: Once | CUTANEOUS | Status: DC

## 2017-10-11 NOTE — Progress Notes (Addendum)
PCP: Dr. Nita SellsJohn Smith  Cardiologist: pt denies  EKG: 01/31/17 in EPIC  Stress test:pt denies   ECHO: 15 + years ago -normal-problem was related to over-active thyroid  Cardiac Cath: pt denies  Chest x-ray: pt denies past year

## 2017-10-15 MED ORDER — IOPAMIDOL (ISOVUE-300) INJECTION 61%
INTRAVENOUS | Status: AC
  Filled 2017-10-15: qty 100

## 2017-10-17 ENCOUNTER — Ambulatory Visit
Admission: RE | Admit: 2017-10-17 | Discharge: 2017-10-17 | Disposition: A | Discharge status: Home / Self Care | Payer: BLUE CROSS/BLUE SHIELD | Source: Ambulatory Visit | Attending: Surgery | Admitting: Surgery

## 2017-10-17 DIAGNOSIS — N6320 Unspecified lump in the left breast, unspecified quadrant: Secondary | ICD-10-CM

## 2017-10-17 NOTE — Anesthesia Preprocedure Evaluation (Addendum)
Anesthesia Evaluation  Patient identified by MRN, date of birth, ID band Patient awake    Reviewed: Allergy & Precautions, H&P , NPO status , Patient's Chart, lab work & pertinent test results, reviewed documented beta blocker date and time   Airway Mallampati: II  TM Distance: >3 FB Neck ROM: Full    Dental no notable dental hx. (+) Teeth Intact, Dental Advisory Given   Pulmonary Current Smoker,    Pulmonary exam normal breath sounds clear to auscultation       Cardiovascular Exercise Tolerance: Good hypertension, Pt. on medications and Pt. on home beta blockers negative cardio ROS   Rhythm:Regular Rate:Normal     Neuro/Psych negative neurological ROS  negative psych ROS   GI/Hepatic Neg liver ROS, GERD  Medicated and Controlled,  Endo/Other  Hypothyroidism   Renal/GU negative Renal ROS  negative genitourinary   Musculoskeletal   Abdominal   Peds  Hematology negative hematology ROS (+)   Anesthesia Other Findings   Reproductive/Obstetrics negative OB ROS                            Anesthesia Physical Anesthesia Plan  ASA: II  Anesthesia Plan: MAC   Post-op Pain Management:    Induction: Intravenous  PONV Risk Score and Plan: 3 and Ondansetron, Dexamethasone and Midazolam  Airway Management Planned: Simple Face Mask  Additional Equipment:   Intra-op Plan:   Post-operative Plan: Extubation in OR  Informed Consent: I have reviewed the patients History and Physical, chart, labs and discussed the procedure including the risks, benefits and alternatives for the proposed anesthesia with the patient or authorized representative who has indicated his/her understanding and acceptance.   Dental advisory given  Plan Discussed with: CRNA  Anesthesia Plan Comments:        Anesthesia Quick Evaluation

## 2017-10-17 NOTE — H&P (Signed)
Stephan MinisterMary A Hinnenkamp  [facility] Patient #: [id number] DOB: 1960-09-01 Single / Language: [place] / Race: White Female  History of Present Illness   The patient is a 57 year old female who presents with a complaint of breast abnormality.  The PCP is Dr. Hughie ClossZ. Hall  The patient was referred by Dr. Joette CatchingS. Chacko  She comes by herself.  She had not had a mammogram in years. She had felt a mass in her left breast. She is unsure how long the mass has been there. She has no family history of breast cancer. She is not on hormone pills. She had a mammogram on 08/30/2017 which showed 1. There is a suspicious 3.9 cm mass in the retroareolar left breast. 2. There is a suspicious area of distortion in the upper-outer left breast. 3. There are suspicious calcifications involving the majority of the upper-outer quadrant of the left breast. Note that the calcifications, distortion and mass all together span approximately 12-13 cm. 4. No evidence of left axillary lymphadenopathy. 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 Rosalie GumsBeth Brown about the findings. The area of CSL will need excision (with seed loc). But the cyst is large enough, I thing that it would be best to excise this at the same time. She agrees. I discussed the palpitations her breast surgery including bleeding, infection, deformity of the breast, and injury to nerves.  Plan: 1) excise the area of the complex grossing lesion left breast with a CT localization, 2) excise the cyst under the nipple  Past Medical History: 1. Smokes 2. HTN 3. Hypothyroid S/P radiation to thyroid for hyperthyroidism 4. She had a workup for palpitations, but it turned out to be hyperthyroidism 5. Takes Mobic for her back She is going to hold this for 7 days before surgery 6. see note in 07/31/2017 about "being arrested" (apparently  for a DUI)  Social History: She is single. 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 Manson PasseyBrown, RMA; 09/21/2017 10:17 AM) No Known Drug Allergies [09/21/2017]: Allergies Reconciled   Medication History (Tanisha A. Manson PasseyBrown, RMA; 09/21/2017 10:18 AM) ALPRAZolam (0.25MG  Tablet, Oral) Active. Levothyroxine Sodium (137MCG Tablet, Oral) Active. Meloxicam (15MG  Tablet, Oral) Active. Omeprazole (20MG  Capsule DR, Oral) Active. Cyclobenzaprine HCl (10MG  Tablet, Oral) Active. IBU (600MG  Tablet, Oral) Active. Medications Reconciled  Vitals (Tanisha A. Brown RMA; 09/21/2017 10:17 AM) 09/21/2017 10:16 AM Weight: 195.2 lb Height: 69in Body Surface Area: 2.04 m Body Mass Index: 28.83 kg/m  Temp.: 98.67F  Pulse: 102 (Regular)  BP: 160/98 (Sitting, Left Arm, Standard)   Physical Exam  General: WN older WF alert and generally healthy appearing. HEENT: Normal. Pupils equal.  Neck: Supple. No mass. No thyroid mass. Lymph Nodes: No supraclavicular or cervical or axillary nodes.  Lungs: Clear to auscultation and symmetric breath sounds. Heart: RRR. No murmur or rub.  Breast: Right - no mass, pendulous Left - 6 x 7 cm subareolar mass. It feels benign and is consistent with the US and biopsy findings.  2 x3 mass at 1 o'clock about 7 cm fromt he edge of the areala. I think that this is a hematoma - the patient says that it was not there prior to the biopsy.  [place]  of breast - confirms cystic nature of subareolar mass (smooth marging, homogenous and hypoechoic), UOQ mass is more irrgular, hypoechoic - hematoma vs tumor [I took photos - which should be in [employer]]  Abdomen: Soft. No mass. No tenderness. No hernia. Normal bowel sounds. Rectal: Not done.  Extremities: Good strength and ROM in upper and lower  extremities.  Neurologic: Grossly intact to motor and sensory function. Psychiatric: Has normal mood and affect. Behavior is normal.   Assessment & Plan  1.  BREAST LESION (N64.9)  Plan:    This looks like it needs to be delayed until the infection in her left breast is resolved.  Addendum Note(Darin Redmann H. Ezzard Standing MD; 10/17/2017 1:11 PM)  Baird Lyons called me about a draining wound from her left breast. We will not put the seed in now, but take care of the infection. The patient is on the schedule tomorrow AM.  I'll just have to evaluate her in the holding area.  2.  CYST OF BREAST, LEFT (N60.02)  Impression: Will plan to excise the cyst at the same time that I excise the more suspicious lesion   I will assess this cyst - if infected, we could I&D it.  I tried to call the patient, but did not get beyond the cell phone message.  3. Smokes 4. HTN 5. Hypothyroid S/P radiation to thyroid for hyperthyroidism 6. She had a workup for palpitations, but it turned out to be hyperthyroidism 7. Takes Mobic for her back She is going to hold this for 7 days before surgery    Ovidio Kin, MD, [facility] Pager: [phone] Office phone:  [phone]

## 2017-10-18 ENCOUNTER — Ambulatory Visit (HOSPITAL_COMMUNITY): Payer: BLUE CROSS/BLUE SHIELD | Admitting: Certified Registered Nurse Anesthetist

## 2017-10-18 ENCOUNTER — Encounter (HOSPITAL_COMMUNITY)
Admission: RE | Disposition: A | Discharge status: Home / Self Care | Payer: Self-pay | Source: Ambulatory Visit | Attending: Surgery

## 2017-10-18 ENCOUNTER — Encounter (HOSPITAL_COMMUNITY): Payer: Self-pay | Admitting: *Deleted

## 2017-10-18 ENCOUNTER — Ambulatory Visit
Admission: RE | Admit: 2017-10-18 | Discharge: 2017-10-18 | Disposition: A | Discharge status: Home / Self Care | Payer: BLUE CROSS/BLUE SHIELD | Source: Ambulatory Visit | Attending: Surgery | Admitting: Surgery

## 2017-10-18 ENCOUNTER — Ambulatory Visit (HOSPITAL_COMMUNITY)
Admission: RE | Admit: 2017-10-18 | Discharge: 2017-10-18 | Disposition: A | Discharge status: Home / Self Care | Payer: BLUE CROSS/BLUE SHIELD | Source: Ambulatory Visit | Attending: Surgery | Admitting: Surgery

## 2017-10-18 DIAGNOSIS — Z791 Long term (current) use of non-steroidal anti-inflammatories (NSAID): Secondary | ICD-10-CM | POA: Insufficient documentation

## 2017-10-18 DIAGNOSIS — Z79890 Hormone replacement therapy (postmenopausal): Secondary | ICD-10-CM | POA: Diagnosis not present

## 2017-10-18 DIAGNOSIS — I10 Essential (primary) hypertension: Secondary | ICD-10-CM | POA: Diagnosis not present

## 2017-10-18 DIAGNOSIS — Z79899 Other long term (current) drug therapy: Secondary | ICD-10-CM | POA: Insufficient documentation

## 2017-10-18 DIAGNOSIS — F17200 Nicotine dependence, unspecified, uncomplicated: Secondary | ICD-10-CM | POA: Diagnosis not present

## 2017-10-18 DIAGNOSIS — N6489 Other specified disorders of breast: Secondary | ICD-10-CM | POA: Insufficient documentation

## 2017-10-18 DIAGNOSIS — N6320 Unspecified lump in the left breast, unspecified quadrant: Secondary | ICD-10-CM

## 2017-10-18 DIAGNOSIS — K219 Gastro-esophageal reflux disease without esophagitis: Secondary | ICD-10-CM | POA: Insufficient documentation

## 2017-10-18 DIAGNOSIS — N6001 Solitary cyst of right breast: Secondary | ICD-10-CM | POA: Diagnosis not present

## 2017-10-18 DIAGNOSIS — Z923 Personal history of irradiation: Secondary | ICD-10-CM | POA: Insufficient documentation

## 2017-10-18 DIAGNOSIS — E039 Hypothyroidism, unspecified: Secondary | ICD-10-CM | POA: Diagnosis not present

## 2017-10-18 HISTORY — PX: INCISION AND DRAINAGE OF WOUND: SHX1803

## 2017-10-18 SURGERY — IRRIGATION AND DEBRIDEMENT WOUND
Anesthesia: Monitor Anesthesia Care | Site: Breast | Laterality: Left

## 2017-10-18 MED ORDER — PROPOFOL 500 MG/50ML IV EMUL
INTRAVENOUS | Status: DC | PRN
  Administered 2017-10-18: 75 ug/kg/min via INTRAVENOUS

## 2017-10-18 MED ORDER — ONDANSETRON HCL 4 MG/2ML IJ SOLN
INTRAMUSCULAR | Status: DC | PRN
  Administered 2017-10-18: 4 mg via INTRAVENOUS

## 2017-10-18 MED ORDER — MIDAZOLAM HCL 2 MG/2ML IJ SOLN
INTRAMUSCULAR | Status: DC | PRN
  Administered 2017-10-18: 2 mg via INTRAVENOUS

## 2017-10-18 MED ORDER — BUPIVACAINE-EPINEPHRINE (PF) 0.5% -1:200000 IJ SOLN
INTRAMUSCULAR | Status: AC
  Filled 2017-10-18: qty 30

## 2017-10-18 MED ORDER — BUPIVACAINE-EPINEPHRINE 0.5% -1:200000 IJ SOLN
INTRAMUSCULAR | Status: DC | PRN
  Administered 2017-10-18: 20 mL

## 2017-10-18 MED ORDER — GABAPENTIN 300 MG PO CAPS
ORAL_CAPSULE | ORAL | Status: AC
Start: 2017-10-18 — End: 2017-10-18
  Administered 2017-10-18: 300 mg
  Filled 2017-10-18: qty 1

## 2017-10-18 MED ORDER — LACTATED RINGERS IV SOLN
INTRAVENOUS | Status: DC | PRN
  Administered 2017-10-18: 08:00:00 via INTRAVENOUS

## 2017-10-18 MED ORDER — 0.9 % SODIUM CHLORIDE (POUR BTL) OPTIME
TOPICAL | Status: DC | PRN
  Administered 2017-10-18: 1000 mL

## 2017-10-18 MED ORDER — FENTANYL CITRATE (PF) 250 MCG/5ML IJ SOLN
INTRAMUSCULAR | Status: AC
Start: 2017-10-18 — End: 2017-10-18
  Filled 2017-10-18: qty 5

## 2017-10-18 MED ORDER — PROPOFOL 10 MG/ML IV BOLUS
INTRAVENOUS | Status: DC | PRN
  Administered 2017-10-18: 40 mg via INTRAVENOUS

## 2017-10-18 MED ORDER — ACETAMINOPHEN 500 MG PO TABS: 1000.0000 mg | ORAL_TABLET | ORAL | Status: DC

## 2017-10-18 MED ORDER — GABAPENTIN 300 MG PO CAPS: 300.0000 mg | ORAL_CAPSULE | ORAL | Status: DC

## 2017-10-18 MED ORDER — CEFAZOLIN SODIUM-DEXTROSE 2-4 GM/100ML-% IV SOLN
INTRAVENOUS | Status: AC
  Filled 2017-10-18: qty 100

## 2017-10-18 MED ORDER — ACETAMINOPHEN 500 MG PO TABS
ORAL_TABLET | ORAL | Status: AC
  Administered 2017-10-18: 1000 mg
  Filled 2017-10-18: qty 2

## 2017-10-18 MED ORDER — HYDROCODONE-ACETAMINOPHEN 5-325 MG PO TABS: 1.0000 | ORAL_TABLET | Freq: Four times a day (QID) | ORAL | 0 refills | Status: DC | PRN

## 2017-10-18 MED ORDER — PROPOFOL 10 MG/ML IV BOLUS
INTRAVENOUS | Status: AC
  Filled 2017-10-18: qty 40

## 2017-10-18 MED ORDER — MIDAZOLAM HCL 2 MG/2ML IJ SOLN
INTRAMUSCULAR | Status: AC
  Filled 2017-10-18: qty 2

## 2017-10-18 MED ORDER — CEFAZOLIN SODIUM-DEXTROSE 2-4 GM/100ML-% IV SOLN
2.0000 g | INTRAVENOUS | Status: AC
  Administered 2017-10-18: 2 g via INTRAVENOUS

## 2017-10-18 MED ORDER — FENTANYL CITRATE (PF) 100 MCG/2ML IJ SOLN
INTRAMUSCULAR | Status: DC | PRN
  Administered 2017-10-18: 50 ug via INTRAVENOUS

## 2017-10-18 SURGICAL SUPPLY — 46 items
ADH SKN CLS APL DERMABOND .7 (GAUZE/BANDAGES/DRESSINGS)
APPLIER CLIP 9.375 MED OPEN (MISCELLANEOUS)
APR CLP MED 9.3 20 MLT OPN (MISCELLANEOUS)
BINDER BREAST LRG (GAUZE/BANDAGES/DRESSINGS) IMPLANT
BINDER BREAST XLRG (GAUZE/BANDAGES/DRESSINGS) IMPLANT
BLADE SURG 15 STRL LF DISP TIS (BLADE) ×2 IMPLANT
BLADE SURG 15 STRL SS (BLADE) ×3
CANISTER SUCT 3000ML PPV (MISCELLANEOUS) IMPLANT
CHLORAPREP W/TINT 26ML (MISCELLANEOUS) ×1 IMPLANT
CLIP APPLIE 9.375 MED OPEN (MISCELLANEOUS) IMPLANT
CLIP VESOCCLUDE SM WIDE 6/CT (CLIP) ×1 IMPLANT
COVER PROBE W GEL 5X96 (DRAPES) ×1 IMPLANT
COVER SURGICAL LIGHT HANDLE (MISCELLANEOUS) ×3 IMPLANT
DERMABOND ADVANCED (GAUZE/BANDAGES/DRESSINGS)
DERMABOND ADVANCED .7 DNX12 (GAUZE/BANDAGES/DRESSINGS) ×1 IMPLANT
DEVICE DUBIN SPECIMEN MAMMOGRA (MISCELLANEOUS) ×1 IMPLANT
DRAPE CHEST BREAST 15X10 FENES (DRAPES) ×3 IMPLANT
DRAPE UTILITY XL STRL (DRAPES) ×3 IMPLANT
DRSG PAD ABDOMINAL 8X10 ST (GAUZE/BANDAGES/DRESSINGS) ×3 IMPLANT
ELECT COATED BLADE 2.86 ST (ELECTRODE) ×3 IMPLANT
ELECT REM PT RETURN 9FT ADLT (ELECTROSURGICAL) ×3
ELECTRODE REM PT RTRN 9FT ADLT (ELECTROSURGICAL) ×2 IMPLANT
GAUZE SPONGE 4X4 12PLY STRL (GAUZE/BANDAGES/DRESSINGS) ×3 IMPLANT
GLOVE SURG SIGNA 7.5 PF LTX (GLOVE) ×3 IMPLANT
GOWN STRL REUS W/ TWL LRG LVL3 (GOWN DISPOSABLE) ×2 IMPLANT
GOWN STRL REUS W/ TWL XL LVL3 (GOWN DISPOSABLE) ×2 IMPLANT
GOWN STRL REUS W/TWL LRG LVL3 (GOWN DISPOSABLE) ×3
GOWN STRL REUS W/TWL XL LVL3 (GOWN DISPOSABLE) ×3
ILLUMINATOR WAVEGUIDE N/F (MISCELLANEOUS) IMPLANT
KIT BASIN OR (CUSTOM PROCEDURE TRAY) ×3 IMPLANT
KIT MARKER MARGIN INK (KITS) ×1 IMPLANT
LIGHT WAVEGUIDE WIDE FLAT (MISCELLANEOUS) IMPLANT
NDL HYPO 25GX1X1/2 BEV (NEEDLE) ×1 IMPLANT
NEEDLE HYPO 25GX1X1/2 BEV (NEEDLE) ×3 IMPLANT
NS IRRIG 1000ML POUR BTL (IV SOLUTION) IMPLANT
PACK SURGICAL SETUP 50X90 (CUSTOM PROCEDURE TRAY) ×3 IMPLANT
PENCIL BUTTON HOLSTER BLD 10FT (ELECTRODE) ×3 IMPLANT
SPONGE LAP 18X18 X RAY DECT (DISPOSABLE) ×3 IMPLANT
SUT MNCRL AB 4-0 PS2 18 (SUTURE) ×1 IMPLANT
SUT VIC AB 3-0 SH 8-18 (SUTURE) ×1 IMPLANT
SYR BULB 3OZ (MISCELLANEOUS) ×3 IMPLANT
SYR CONTROL 10ML LL (SYRINGE) ×3 IMPLANT
TOWEL OR 17X24 6PK STRL BLUE (TOWEL DISPOSABLE) ×3 IMPLANT
TOWEL OR 17X26 10 PK STRL BLUE (TOWEL DISPOSABLE) ×3 IMPLANT
TUBE CONNECTING 12X1/4 (SUCTIONS) ×3 IMPLANT
YANKAUER SUCT BULB TIP NO VENT (SUCTIONS) ×3 IMPLANT

## 2017-10-18 NOTE — Transfer of Care (Signed)
Immediate Anesthesia Transfer of Care Note  Patient: Mallory Smith  Procedure(s) Performed: IRRIGATION AND DEBRIDEMENT LEFT BREAST (Left Breast)  Patient Location: PACU  Anesthesia Type:MAC  Level of Consciousness: awake, alert  and oriented  Airway & Oxygen Therapy: Patient Spontanous Breathing  Post-op Assessment: Report given to RN and Post -op Vital signs reviewed and stable  Post vital signs: Reviewed and stable  Last Vitals:  Vitals:   10/18/17 0545 10/18/17 0610  BP: (!) 169/99 (!) 146/91  Pulse: 73   Resp: 20   Temp: 36.5 C   SpO2: 100%     Last Pain:  Vitals:   10/18/17 0545  TempSrc: Oral      Patients Stated Pain Goal: 2 (80/03/49 1791)  Complications: No apparent anesthesia complications

## 2017-10-18 NOTE — Anesthesia Postprocedure Evaluation (Signed)
Anesthesia Post Note  Patient: Mallory Smith  Procedure(s) Performed: IRRIGATION AND DEBRIDEMENT LEFT BREAST (Left Breast)     Patient location during evaluation: PACU Anesthesia Type: MAC Level of consciousness: awake and alert Pain management: pain level controlled Vital Signs Assessment: post-procedure vital signs reviewed and stable Respiratory status: spontaneous breathing, nonlabored ventilation and respiratory function stable Cardiovascular status: stable and blood pressure returned to baseline Postop Assessment: no apparent nausea or vomiting Anesthetic complications: no    Last Vitals:  Vitals:   10/18/17 0900 10/18/17 0907  BP: (!) 152/98 (!) 160/90  Pulse: 65 62  Resp: 14 15  Temp: (!) 36.4 C   SpO2: 100% 100%    Last Pain:  Vitals:   10/18/17 0907  TempSrc:   PainSc: 0-No pain                 Revonda Menter,W. EDMOND

## 2017-10-18 NOTE — Op Note (Signed)
10/18/2017  8:15 AM  PATIENT:  Mallory Smith, 57 y.o., female, MRN: [id number]  PREOP DIAGNOSIS:  Infected cyst, left breast, 5 o'clock  POSTOP DIAGNOSIS:   Infected cyst, left breast, 5 o'clock  PROCEDURE:   Procedure(s): IRRIGATION AND DEBRIDEMENT LEFT BREAST infected cyst  SURGEON:   Alphonsa Overall, M.D.  ASSISTANT:   None  ANESTHESIA:   MAC  Anesthesiologist: Roderic Palau, MD CRNA: Leonor Liv, CRNA  General  EBL:  minimal  ml  BLOOD ADMINISTERED: none  DRAINS: none   LOCAL MEDICATIONS USED:   15 cc of 1/4% marcaine  SPECIMEN:   Left breast cyst  COUNTS CORRECT:  YES  INDICATIONS FOR PROCEDURE:  [name] is a 57 y.o. (DOB: 16-Apr-1961) white female whose primary care physician is Celene Squibb, MD and comes for removal of left breast cyst and biopsy of CSL.  However, she presented to the breast center yesterday with an infection in the 5 o'clock position of the left breast, that seemed unrelated to he other 2 problems in her breast.   The indications and risks of the surgery were explained to the patient.  The risks include, but are not limited to, infection, bleeding, and nerve injury.  PROCEDURE:  The patient was taken to room #1 at [facility].  Her left breast was prepped with Betadine and sterilely draped.   A timeout was held and the surgical checklist run.   She had an infected area of her left breast at the 5 o'clock position.  This appeared to be more of the skin than in the breast itself.  I placed a block around the infected area using 15 cc of quarter percent Marcaine.   I obtained cultures for aerobic and anaerobic organisms from the wound..   I then incised into the infection.  I cut out a circle of tissue approximately 2 cm in diameter draining the entire infected area.  The tissue that I excised was sent to pathology.  I then irrigated the wound with saline.  And dressed the wound with saline gauze and sterile dressings.   The sponge and  needle count were correct at the end of the case.  She was transferred to the recovery room in good condition.  Alphonsa Overall, MD, [facility] Surgery Pager: [phone] Office phone:  [phone]

## 2017-10-18 NOTE — Discharge Instructions (Signed)
[facility] [place] [facility] - DISCHARGE INSTRUCTIONS TO PATIENT  Activity:  Driving - May drive tomorrow   Lifting - No limiit  Wound Care:   Leave dressing until tomorrow.  Then remove dressing and shower at least twice a day.  Then put damp gauze over the wound.  Diet:  As tolerated  Follow up appointment:  Call Dr. Allene PyoNewman's office [facility]) at [phone] for an appointment in 1 week.  Medications and dosages:  Resume your home medications.  You have a prescription for:  Vicodin  Call Dr. Ezzard StandingNewman or his office  [phone]) if you have:  Temperature greater than 100.4,  Persistent nausea and vomiting,  Severe uncontrolled pain,  Redness, tenderness, or signs of infection (pain, swelling, redness, odor or green/yellow discharge around the site),  Difficulty breathing, headache or visual disturbances,  Any other questions or concerns you may have after discharge.  In an emergency, call 911 or go to an Emergency Department at a nearby hospital.

## 2017-10-18 NOTE — Interval H&P Note (Signed)
History and Physical Interval Note:  10/18/2017 7:27 AM  Mallory MinisterMary A Smith  has presented today for surgery, with the diagnosis of Complex sclerosing lesion of left breast, cyst of left breast  The various methods of treatment have been discussed with the patient and family.    Change of plan - she has an infection in the left breast at the 5 o'clock position.  It seems unrelated to her subareolar cyst or the CSL in the UOQ of the left breast.   Will plan I&D of infected left breast area - then address the cyst and CSL later, when all is clean.  The patient's daughter is in the room with her.  After consideration of risks, benefits and other options for treatment, the patient has consented to  Procedure(s): LEFT RADIOACTIVE SEED GUIDED EXCISIONAL BREAST BIOPSY X1 AND LEFT BREAST MASS EXCISION ERAS PATHWAY (Left) as a surgical intervention .  The patient's history has been reviewed, patient examined, no change in status, stable for surgery.  I have reviewed the patient's chart and labs.  Questions were answered to the patient's satisfaction.     Kandis Cockingavid H Idali Lafever

## 2017-10-18 NOTE — Anesthesia Procedure Notes (Signed)
Procedure Name: MAC Date/Time: 10/18/2017 7:36 AM Performed by: Leonor Liv, CRNA Oxygen Delivery Method: Nasal cannula Placement Confirmation: positive ETCO2

## 2017-10-19 ENCOUNTER — Encounter (HOSPITAL_COMMUNITY): Payer: Self-pay | Admitting: Surgery

## 2017-10-25 LAB — AEROBIC/ANAEROBIC CULTURE W GRAM STAIN (SURGICAL/DEEP WOUND)

## 2017-12-03 IMAGING — US US ABDOMEN LIMITED
1 series · 14 of 25 positions shown · non-contrast
Comparison: CT of the abdomen and pelvis on 01/29/2017 and
12/11/2006, ultrasound of the abdomen on 05/31/2006

CLINICAL DATA: Abdominal pain for 1 week. History of GERD,
hypertension, smoking. Left lobe hemangioma identified on prior
ultrasound.

EXAM:
ULTRASOUND ABDOMEN LIMITED RIGHT UPPER QUADRANT

[Series 1: us abdomen limited · 0.16mm/px · 14 of 64 slices shown]
[im 1/64]
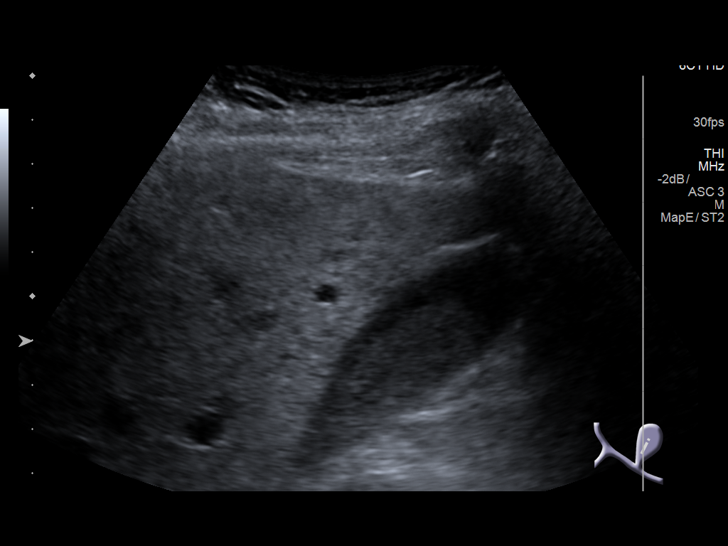
[im 6/64]
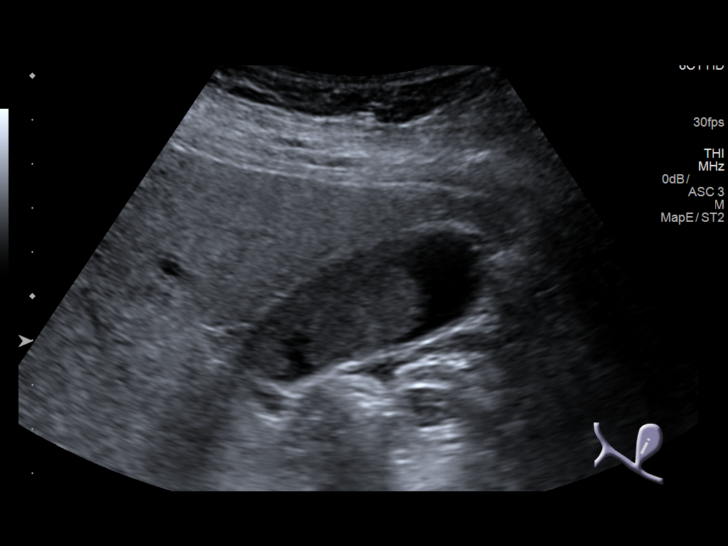
[im 11/64]
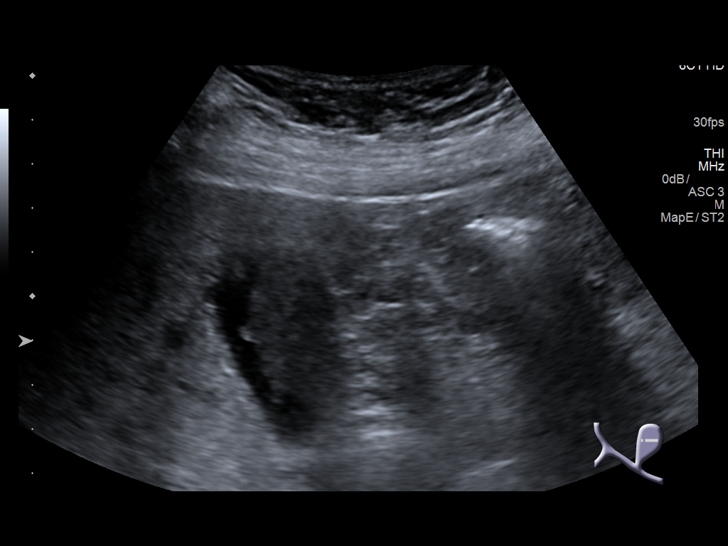
[im 16/64]
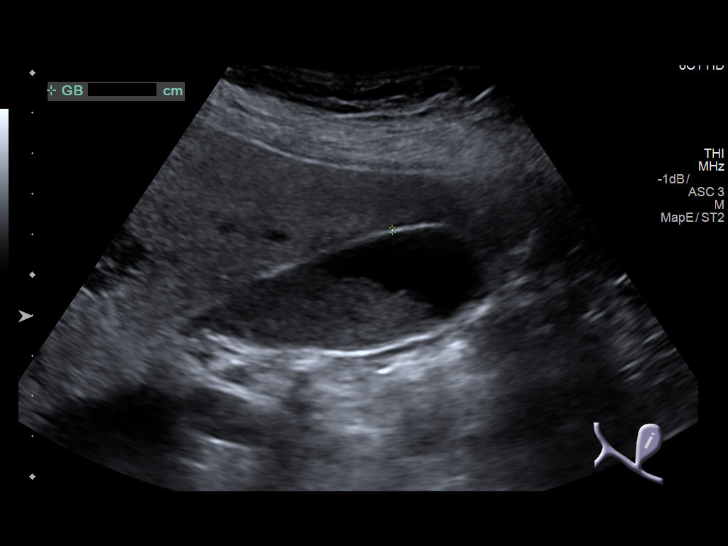
[im 22/64]
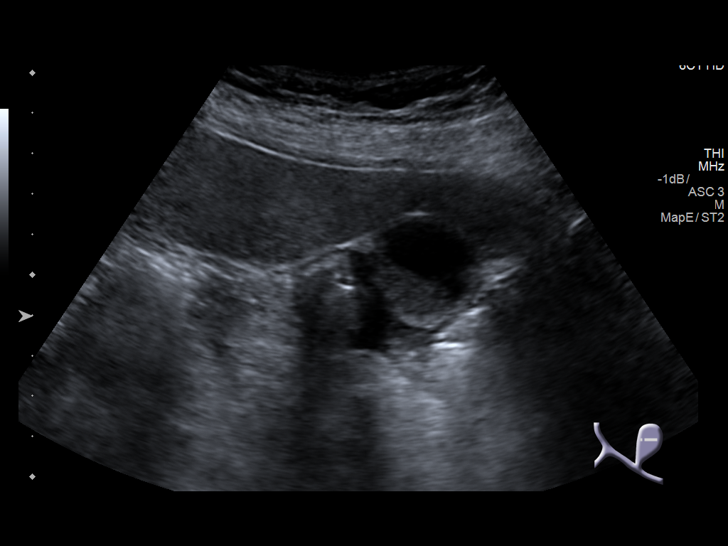
[im 24/64]
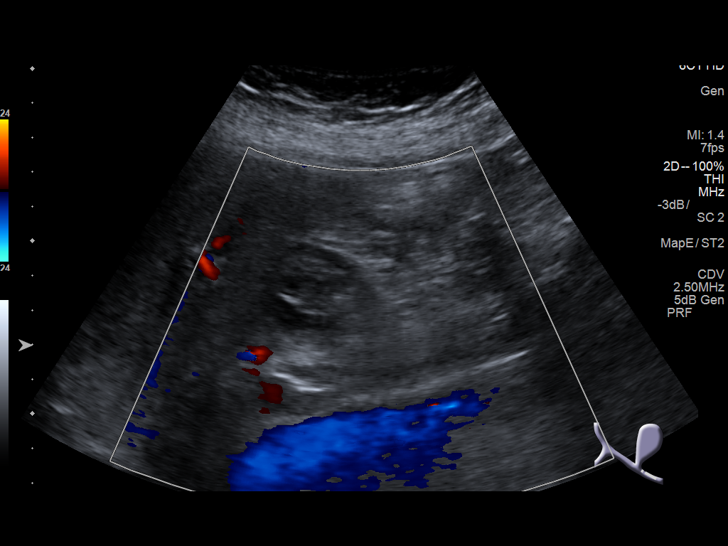
[im 29/64]
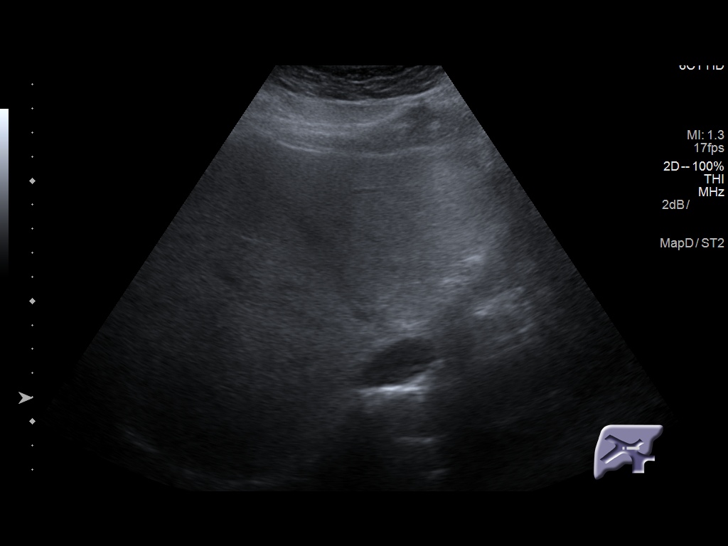
[im 35/64]
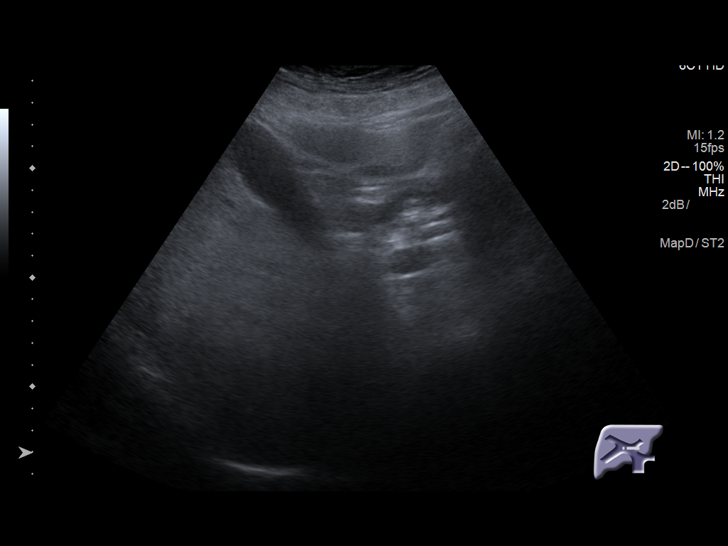
[im 40/64]
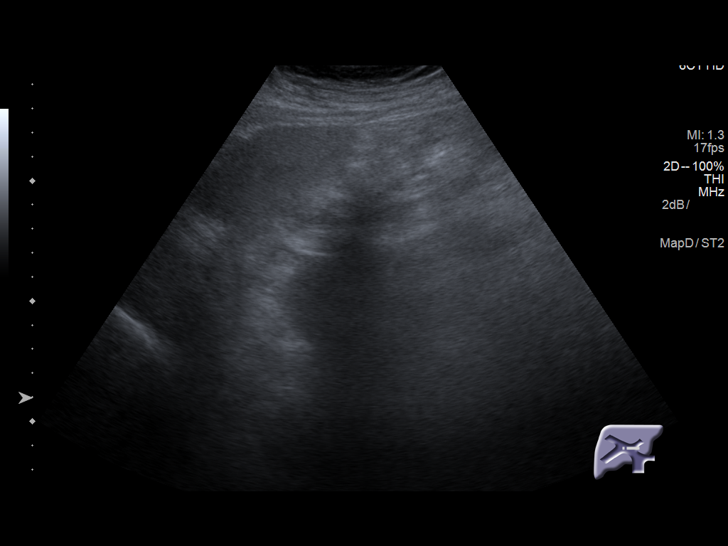
[im 43/64]
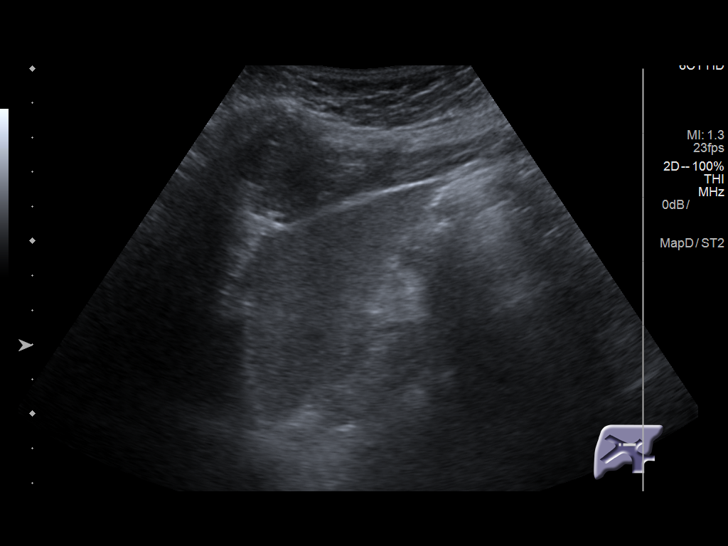
[im 48/64]
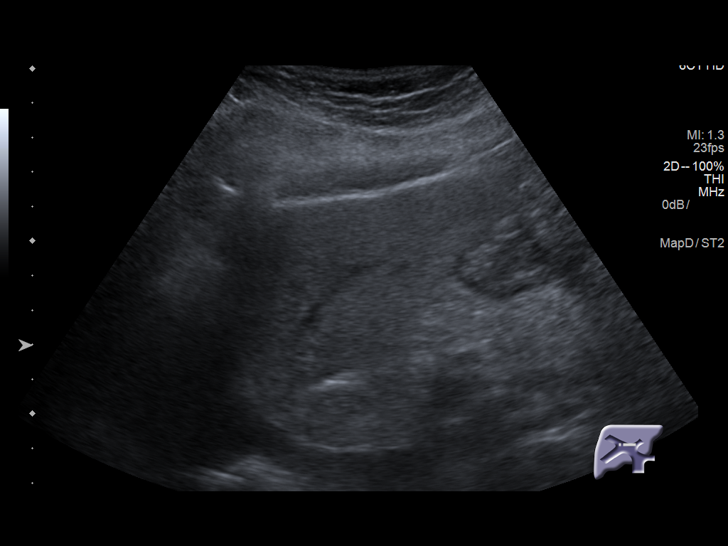
[im 53/64]
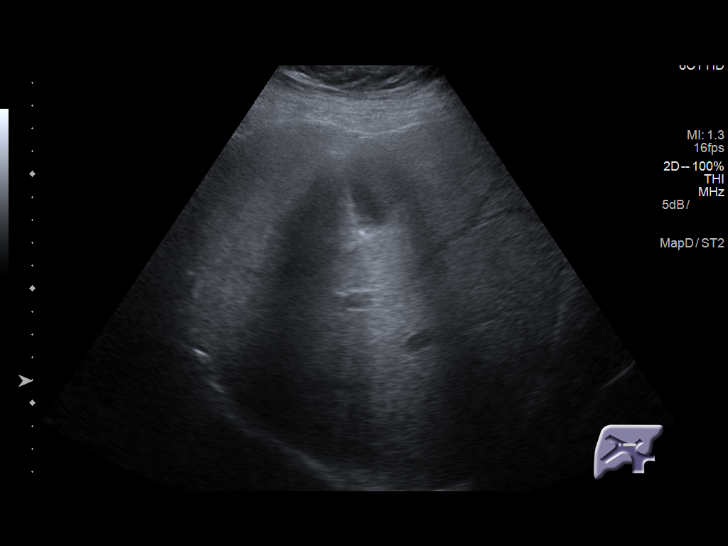
[im 58/64]
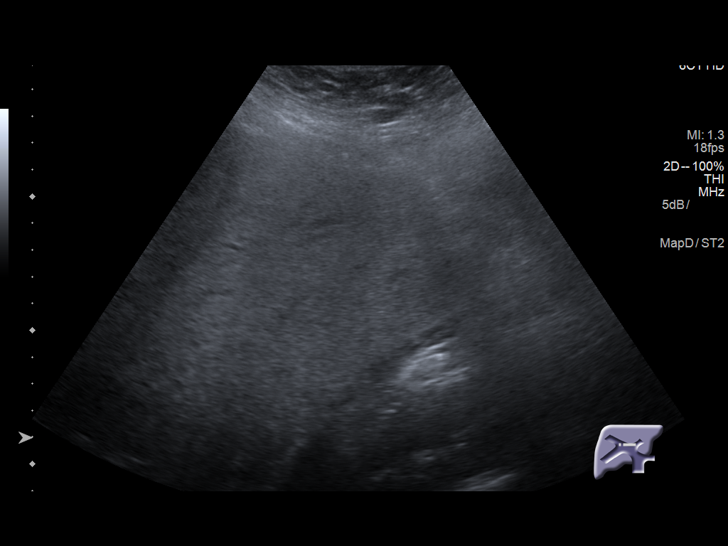
[im 64/64]
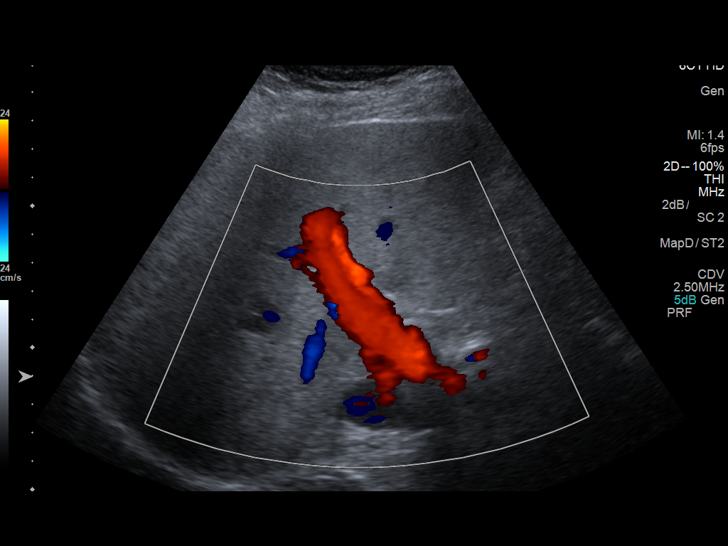

[14 of 25 positions shown; findings below may reference images not displayed]

FINDINGS: Gallbladder:

Gallbladder wall is 1.1 mm in thickness. Significant tumefactive
sludge identified within the gallbladder. No definite stones are
present. No pericholecystic fluid or sonographic Murphy's sign.

Common bile duct:

Diameter: 3.4 mm

Liver:

The liver is echogenic without other features of hepatic steatosis.
The hyperechoic lesion previously noted in the left hepatic lobe is
not imaged on today's exam. No new or suspicious hepatic lesions.
IMPRESSION: 1. Tumefactive sludge within the gallbladder. No other evidence for
acute cholecystitis.
2. Echogenic liver parenchyma without focal liver lesion identified.
Previously identified lesion in the left hepatic lobe is not seen
today.

## 2018-01-01 ENCOUNTER — Ambulatory Visit: Payer: Self-pay | Admitting: Surgery

## 2018-01-06 ENCOUNTER — Encounter (HOSPITAL_BASED_OUTPATIENT_CLINIC_OR_DEPARTMENT_OTHER): Payer: Self-pay | Admitting: *Deleted

## 2018-01-06 ENCOUNTER — Other Ambulatory Visit: Payer: Self-pay

## 2018-01-12 NOTE — H&P (Signed)
Stephan Minister Location: [facility] Surgery Patient #: [id number] DOB: 11/24/1960 Single / Language: Mallory Smith / Race: White Female  History of Present Illness   The patient is a 57 year old female who presents with a complaint of breast abnormality.  The PCP is Dr. Hughie Closs  The patient was referred by Dr. Joette Catching  She comes by herself.  I did an I&D of an infected cyst in her left breast on 10/18/2017.  She still has this persistent cyst/mass in the retroareolar position of the left breast. She has a sinus tract which drains stuff. The cyst is not acutely inflamed. I discussed with her about excising this cyst. I would do this first, get the wound to heal entirely, and then address the complex sclerosing lesion of her left breast.  I gave her copies of her path reporst again - and went over the potential risks: Infection, bleeding, recurrences, and poorly healing wound.  History of left breast abnormalities: She had not had a mammogram in years. She had felt a mass in her left breast. She is unsure how long the mass has been there. She has no family history of breast cancer. She is not on hormone pills. She had a mammogram on 08/30/2017 which showed 1. There is a suspicious 3.9 cm mass in the retroareolar left breast. 2. There is a suspicious area of distortion in the upper-outer left breast. 3. There are suspicious calcifications involving the majority of the upper-outer quadrant of the left breast. Note that the calcifications, distortion and mass all together span approximately 12-13 cm. 4. No evidence of left axillary lymphadenopathy. She had a left breast biopsy on 09/07/2017 ([id number]) - which showed a CSL and a ruptured squamous cyst  Past Medical History: 1. Complex sclerosing lesion of left breast 2. Chronic cyst of left breast I&D on 10/18/2017 - but cyst is persistent 3. Smokes 4. HTN 5. hypothyroid S/P  radiation to thyroid for hyperthyroidism 6. She had a workup for palpitations, but it turned out to be hyperthyroidism 7. Takes Mobic for her back She is going to hold this for 7 days before surgery 8. see note in 07/31/2017 about "being arrested" (apparently for a DUI)  Social History: She is single. 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 Metoprolol Tartrate (  Tablet, Oral) Active. ALPRAZolam (0.25MG  Tablet, Oral) Active. Levothyroxine Sodium ( Tablet, Oral) Active. Meloxicam (  Tablet, Oral) Active. Omeprazole (  Capsule DR, Oral) Active. Cyclobenzaprine HCl (  Tablet, Oral) Active. IBU (  Tablet, Oral) Active. Medications Reconciled  Vitals Maurilio Lovely; 12/22/2017 4:52 PM) 12/22/2017 4:51 PM Weight: 202.13 lb Height: 69in Body Surface Area: 2.07 m Body Mass Index: 29.85 kg/m  Temp.: 98.50F(Oral)  Pulse: 82 (Regular)  BP: 134/82 (Sitting, Left Arm, Standard)  Physical Exam  General: WN older WF alert and generally healthy appearing. HEENT: Normal. Pupils equal.  Neck: Supple. No mass. No thyroid mass.  Lymph Nodes: No supraclavicular or cervical or axillary nodes.  Lungs: Clear to auscultation and symmetric breath sounds. Heart: RRR. No murmur or rub.  Breast: Right - no mass, pendulous Left - 3 x 4 cm non tender subareolar mass.  Draining sinus at  the 4 o'clock position -  I ultrasounded this mass and took pictures  Extremities: Good strength and ROM in upper and lower extremities.   Assessment & Plan  1.  CYST OF  BREAST, LEFT (N60.02)  Story: I&D of left breast cyst/abscess - D. Kenniel Bergsma - 10/18/2017  Plan:  1) Excise the cyst of left breast, then address CSL of left breast after the cyst/wound has healed.  2.  BREAST LESION (N64.9)  Plan:  1) Complex sclerosing lesion of the superior central LEFT breast - this will be addressed when the chronic cyst of the left breast is excised and healed  3. Smokes 4. HTN 5. hypothyroid S/P radiation to thyroid for hyperthyroidism 6. She had a workup for palpitations, but it turned out to be hyperthyroidism 7. Takes Mobic for her back She is going to hold this for 7 days before surgery   Ovidio Kin, MD, [facility] Surgery Pager: [phone] Office phone:  [phone]

## 2018-01-13 ENCOUNTER — Encounter (HOSPITAL_BASED_OUTPATIENT_CLINIC_OR_DEPARTMENT_OTHER): Payer: Self-pay | Admitting: Certified Registered"

## 2018-01-13 ENCOUNTER — Ambulatory Visit (HOSPITAL_BASED_OUTPATIENT_CLINIC_OR_DEPARTMENT_OTHER): Payer: BLUE CROSS/BLUE SHIELD | Admitting: Anesthesiology

## 2018-01-13 ENCOUNTER — Other Ambulatory Visit: Payer: Self-pay

## 2018-01-13 ENCOUNTER — Encounter (HOSPITAL_BASED_OUTPATIENT_CLINIC_OR_DEPARTMENT_OTHER)
Admission: RE | Disposition: A | Discharge status: Home / Self Care | Payer: Self-pay | Source: Ambulatory Visit | Attending: Surgery

## 2018-01-13 ENCOUNTER — Ambulatory Visit (HOSPITAL_BASED_OUTPATIENT_CLINIC_OR_DEPARTMENT_OTHER)
Admission: RE | Admit: 2018-01-13 | Discharge: 2018-01-13 | Disposition: A | Discharge status: Home / Self Care | Payer: BLUE CROSS/BLUE SHIELD | Source: Ambulatory Visit | Attending: Surgery | Admitting: Surgery

## 2018-01-13 DIAGNOSIS — E039 Hypothyroidism, unspecified: Secondary | ICD-10-CM | POA: Insufficient documentation

## 2018-01-13 DIAGNOSIS — Z79899 Other long term (current) drug therapy: Secondary | ICD-10-CM | POA: Insufficient documentation

## 2018-01-13 DIAGNOSIS — I10 Essential (primary) hypertension: Secondary | ICD-10-CM | POA: Insufficient documentation

## 2018-01-13 DIAGNOSIS — Z923 Personal history of irradiation: Secondary | ICD-10-CM | POA: Insufficient documentation

## 2018-01-13 DIAGNOSIS — N6002 Solitary cyst of left breast: Secondary | ICD-10-CM | POA: Diagnosis present

## 2018-01-13 DIAGNOSIS — F17200 Nicotine dependence, unspecified, uncomplicated: Secondary | ICD-10-CM | POA: Diagnosis not present

## 2018-01-13 HISTORY — DX: Nausea with vomiting, unspecified: Z98.890

## 2018-01-13 HISTORY — DX: Nausea with vomiting, unspecified: R11.2

## 2018-01-13 HISTORY — PX: MASS EXCISION: SHX2000

## 2018-01-13 SURGERY — EXCISION MASS
Anesthesia: General | Site: Breast | Laterality: Left

## 2018-01-13 MED ORDER — OXYCODONE HCL 5 MG PO TABS: 5.0000 mg | ORAL_TABLET | Freq: Once | ORAL | Status: DC | PRN

## 2018-01-13 MED ORDER — PROPOFOL 500 MG/50ML IV EMUL
INTRAVENOUS | Status: AC
  Filled 2018-01-13: qty 100

## 2018-01-13 MED ORDER — HYDROCODONE-ACETAMINOPHEN 5-325 MG PO TABS: 1.0000 | ORAL_TABLET | Freq: Four times a day (QID) | ORAL | 0 refills | Status: DC | PRN

## 2018-01-13 MED ORDER — CELECOXIB 200 MG PO CAPS
ORAL_CAPSULE | ORAL | Status: AC
  Filled 2018-01-13: qty 1

## 2018-01-13 MED ORDER — ONDANSETRON HCL 4 MG/2ML IJ SOLN
INTRAMUSCULAR | Status: AC
  Filled 2018-01-13: qty 8

## 2018-01-13 MED ORDER — ACETAMINOPHEN 500 MG PO TABS
1000.0000 mg | ORAL_TABLET | ORAL | Status: AC
  Administered 2018-01-13: 1000 mg via ORAL

## 2018-01-13 MED ORDER — MIDAZOLAM HCL 2 MG/2ML IJ SOLN
INTRAMUSCULAR | Status: AC
  Filled 2018-01-13: qty 2

## 2018-01-13 MED ORDER — LACTATED RINGERS IV SOLN
INTRAVENOUS | Status: DC
  Administered 2018-01-13 (×3): via INTRAVENOUS

## 2018-01-13 MED ORDER — OXYCODONE HCL 5 MG/5ML PO SOLN: 5.0000 mg | Freq: Once | ORAL | Status: DC | PRN

## 2018-01-13 MED ORDER — SCOPOLAMINE 1 MG/3DAYS TD PT72
1.0000 | MEDICATED_PATCH | Freq: Once | TRANSDERMAL | Status: AC | PRN
  Administered 2018-01-13: 1 via TRANSDERMAL

## 2018-01-13 MED ORDER — FENTANYL CITRATE (PF) 100 MCG/2ML IJ SOLN
50.0000 ug | INTRAMUSCULAR | Status: DC | PRN
  Administered 2018-01-13: 100 ug via INTRAVENOUS

## 2018-01-13 MED ORDER — CEFAZOLIN SODIUM-DEXTROSE 2-4 GM/100ML-% IV SOLN
INTRAVENOUS | Status: AC
  Filled 2018-01-13: qty 100

## 2018-01-13 MED ORDER — METHYLENE BLUE 0.5 % INJ SOLN
INTRAVENOUS | Status: AC
Start: 2018-01-13 — End: ?
  Filled 2018-01-13: qty 10

## 2018-01-13 MED ORDER — BUPIVACAINE-EPINEPHRINE (PF) 0.5% -1:200000 IJ SOLN
INTRAMUSCULAR | Status: AC
  Filled 2018-01-13: qty 30

## 2018-01-13 MED ORDER — CHLORHEXIDINE GLUCONATE CLOTH 2 % EX PADS: 6.0000 | MEDICATED_PAD | Freq: Once | CUTANEOUS | Status: DC

## 2018-01-13 MED ORDER — ACETAMINOPHEN 500 MG PO TABS
ORAL_TABLET | ORAL | Status: AC
  Filled 2018-01-13: qty 2

## 2018-01-13 MED ORDER — BUPIVACAINE-EPINEPHRINE (PF) 0.25% -1:200000 IJ SOLN
INTRAMUSCULAR | Status: AC
  Filled 2018-01-13: qty 30

## 2018-01-13 MED ORDER — CELECOXIB 200 MG PO CAPS
200.0000 mg | ORAL_CAPSULE | ORAL | Status: AC
  Administered 2018-01-13: 200 mg via ORAL

## 2018-01-13 MED ORDER — LIDOCAINE HCL (CARDIAC) PF 100 MG/5ML IV SOSY
PREFILLED_SYRINGE | INTRAVENOUS | Status: AC
Start: 2018-01-13 — End: ?
  Filled 2018-01-13: qty 15

## 2018-01-13 MED ORDER — PROPOFOL 10 MG/ML IV BOLUS
INTRAVENOUS | Status: DC | PRN
  Administered 2018-01-13: 150 mg via INTRAVENOUS

## 2018-01-13 MED ORDER — PHENYLEPHRINE 40 MCG/ML (10ML) SYRINGE FOR IV PUSH (FOR BLOOD PRESSURE SUPPORT)
PREFILLED_SYRINGE | INTRAVENOUS | Status: AC
  Filled 2018-01-13: qty 10

## 2018-01-13 MED ORDER — 0.9 % SODIUM CHLORIDE (POUR BTL) OPTIME
TOPICAL | Status: DC | PRN
  Administered 2018-01-13: 1800 mL

## 2018-01-13 MED ORDER — FENTANYL CITRATE (PF) 100 MCG/2ML IJ SOLN
INTRAMUSCULAR | Status: AC
  Filled 2018-01-13: qty 2

## 2018-01-13 MED ORDER — BUPIVACAINE HCL (PF) 0.25 % IJ SOLN
INTRAMUSCULAR | Status: AC
  Filled 2018-01-13: qty 30

## 2018-01-13 MED ORDER — FENTANYL CITRATE (PF) 100 MCG/2ML IJ SOLN: 25.0000 ug | INTRAMUSCULAR | Status: DC | PRN

## 2018-01-13 MED ORDER — ONDANSETRON HCL 4 MG/2ML IJ SOLN
4.0000 mg | Freq: Once | INTRAMUSCULAR | Status: AC | PRN
  Administered 2018-01-13: 4 mg via INTRAVENOUS

## 2018-01-13 MED ORDER — DEXAMETHASONE SODIUM PHOSPHATE 4 MG/ML IJ SOLN
INTRAMUSCULAR | Status: DC | PRN
  Administered 2018-01-13: 10 mg via INTRAVENOUS

## 2018-01-13 MED ORDER — EPHEDRINE SULFATE 50 MG/ML IJ SOLN
INTRAMUSCULAR | Status: DC | PRN
  Administered 2018-01-13: 10 mg via INTRAVENOUS

## 2018-01-13 MED ORDER — MIDAZOLAM HCL 2 MG/2ML IJ SOLN
1.0000 mg | INTRAMUSCULAR | Status: DC | PRN
  Administered 2018-01-13: 2 mg via INTRAVENOUS

## 2018-01-13 MED ORDER — CEFAZOLIN SODIUM-DEXTROSE 2-4 GM/100ML-% IV SOLN
2.0000 g | INTRAVENOUS | Status: AC
  Administered 2018-01-13: 2 g via INTRAVENOUS

## 2018-01-13 MED ORDER — DEXAMETHASONE SODIUM PHOSPHATE 10 MG/ML IJ SOLN
INTRAMUSCULAR | Status: AC
Start: 2018-01-13 — End: ?
  Filled 2018-01-13: qty 2

## 2018-01-13 MED ORDER — LIDOCAINE HCL (CARDIAC) PF 100 MG/5ML IV SOSY
PREFILLED_SYRINGE | INTRAVENOUS | Status: DC | PRN
  Administered 2018-01-13: 60 mg via INTRAVENOUS

## 2018-01-13 MED ORDER — PROPOFOL 500 MG/50ML IV EMUL
INTRAVENOUS | Status: DC | PRN
  Administered 2018-01-13: 100 ug/kg/min via INTRAVENOUS

## 2018-01-13 MED ORDER — SCOPOLAMINE 1 MG/3DAYS TD PT72
MEDICATED_PATCH | TRANSDERMAL | Status: AC
  Filled 2018-01-13: qty 1

## 2018-01-13 MED ORDER — BUPIVACAINE-EPINEPHRINE (PF) 0.25% -1:200000 IJ SOLN
INTRAMUSCULAR | Status: DC | PRN
  Administered 2018-01-13: 30 mL

## 2018-01-13 SURGICAL SUPPLY — 50 items
ADH SKN CLS APL DERMABOND .7 (GAUZE/BANDAGES/DRESSINGS) ×1
APL SKNCLS STERI-STRIP NONHPOA (GAUZE/BANDAGES/DRESSINGS)
BENZOIN TINCTURE PRP APPL 2/3 (GAUZE/BANDAGES/DRESSINGS) IMPLANT
BLADE HEX COATED 2.75 (ELECTRODE) IMPLANT
BLADE SURG 10 STRL SS (BLADE) ×2 IMPLANT
BLADE SURG 15 STRL LF DISP TIS (BLADE) ×1 IMPLANT
BLADE SURG 15 STRL SS (BLADE) ×2
CHLORAPREP W/TINT 26ML (MISCELLANEOUS) ×2 IMPLANT
COVER BACK TABLE 60X90IN (DRAPES) ×2 IMPLANT
COVER MAYO STAND STRL (DRAPES) ×2 IMPLANT
DECANTER SPIKE VIAL GLASS SM (MISCELLANEOUS) ×1 IMPLANT
DERMABOND ADVANCED (GAUZE/BANDAGES/DRESSINGS) ×1
DERMABOND ADVANCED .7 DNX12 (GAUZE/BANDAGES/DRESSINGS) IMPLANT
DRAPE LAPAROTOMY 100X72 PEDS (DRAPES) ×2 IMPLANT
DRAPE UTILITY XL STRL (DRAPES) ×2 IMPLANT
ELECT COATED BLADE 2.86 ST (ELECTRODE) ×1 IMPLANT
ELECT REM PT RETURN 9FT ADLT (ELECTROSURGICAL) ×2
ELECTRODE REM PT RTRN 9FT ADLT (ELECTROSURGICAL) ×1 IMPLANT
GAUZE SPONGE 4X4 12PLY STRL LF (GAUZE/BANDAGES/DRESSINGS) ×2 IMPLANT
GLOVE BIOGEL PI IND STRL 6.5 (GLOVE) IMPLANT
GLOVE BIOGEL PI IND STRL 7.0 (GLOVE) IMPLANT
GLOVE BIOGEL PI INDICATOR 6.5 (GLOVE) ×1
GLOVE BIOGEL PI INDICATOR 7.0 (GLOVE) ×2
GLOVE ECLIPSE 6.5 STRL STRAW (GLOVE) ×1 IMPLANT
GLOVE SURG SIGNA 7.5 PF LTX (GLOVE) ×2 IMPLANT
GLOVE SURG SS PI 7.0 STRL IVOR (GLOVE) ×1 IMPLANT
GOWN STRL REUS W/ TWL LRG LVL3 (GOWN DISPOSABLE) ×1 IMPLANT
GOWN STRL REUS W/TWL LRG LVL3 (GOWN DISPOSABLE) ×4
NDL HYPO 25X1 1.5 SAFETY (NEEDLE) ×1 IMPLANT
NEEDLE HYPO 25X1 1.5 SAFETY (NEEDLE) ×2 IMPLANT
NS IRRIG 1000ML POUR BTL (IV SOLUTION) ×2 IMPLANT
PACK BASIN DAY SURGERY FS (CUSTOM PROCEDURE TRAY) ×2 IMPLANT
PENCIL BUTTON HOLSTER BLD 10FT (ELECTRODE) ×2 IMPLANT
SLEEVE SCD COMPRESS KNEE MED (MISCELLANEOUS) ×1 IMPLANT
SPONGE LAP 18X18 RF (DISPOSABLE) ×1 IMPLANT
STRIP CLOSURE SKIN 1/2X4 (GAUZE/BANDAGES/DRESSINGS) IMPLANT
SUT ETHILON 2 0 FS 18 (SUTURE) IMPLANT
SUT ETHILON 5 0 P 3 18 (SUTURE)
SUT MNCRL AB 4-0 PS2 18 (SUTURE) ×1 IMPLANT
SUT NYLON ETHILON 5-0 P-3 1X18 (SUTURE) IMPLANT
SUT VIC AB 5-0 P-3 18X BRD (SUTURE) IMPLANT
SUT VIC AB 5-0 P3 18 (SUTURE)
SUT VICRYL 3-0 CR8 SH (SUTURE) ×1 IMPLANT
SUT VICRYL 4-0 PS2 18IN ABS (SUTURE) IMPLANT
SYR BULB 3OZ (MISCELLANEOUS) ×2 IMPLANT
SYR CONTROL 10ML LL (SYRINGE) ×2 IMPLANT
TOWEL GREEN STERILE FF (TOWEL DISPOSABLE) ×3 IMPLANT
TOWEL OR NON WOVEN STRL DISP B (DISPOSABLE) ×2 IMPLANT
TUBE CONNECTING 20X1/4 (TUBING) ×1 IMPLANT
YANKAUER SUCT BULB TIP NO VENT (SUCTIONS) ×1 IMPLANT

## 2018-01-13 NOTE — Op Note (Signed)
01/13/2018  9:05 AM  PATIENT:  Mallory Smith, 57 y.o., female, MRN: [id number]  PREOP DIAGNOSIS:  left breast cyst chronic  POSTOP DIAGNOSIS:   Chronic left breast cyst, subareolar  PROCEDURE:   Procedure(s): EXCISION of CYST/MASS LEFT BREAST   SURGEON:   Ovidio Kinavid Makaylia Hewett, M.D.  ASSISTANT:   None  ANESTHESIA:   general  Anesthesiologist: Kipp BroodJoslin, Tykwon Fera, MD CRNA: Blocker, Jewel Baizeimothy D, CRNA  General  EBL:  minimal  ml  BLOOD ADMINISTERED: none  DRAINS: none   LOCAL MEDICATIONS USED:   30 cc of 1/4% marcaine  SPECIMEN:   Left breast mass  COUNTS CORRECT:  YES  INDICATIONS FOR PROCEDURE:  Mallory MinisterMary A Maimone is a 57 y.o. (DOB: [birth year]) whtie female whose primary care physician is Benita StabileHall, John Z, MD and comes for excision of left breast mass.   The indications and risks of the surgery were explained to the patient.  The risks include, but are not limited to, infection, bleeding, and nerve injury.  PROCEDURE:  The patient was taken to room #8 and underwent a general anesthetic.   Prior to her incision, I did a left breast ultrasound identifying an approximate 4.5 centimeter subareolar cyst with a tract that went to the 5:00 position in the left breast with this had drained.  Her left breast was prepped with ChloraPrep and sterilely draped.   A timeout was held and surgical checklist run.   I made an elliptical incision around the track at the 5 o'clock position in the left breast. I carried this incision in the subareolar space excising the mass intact. The mass was also attached to the undersurface of the nipple.  The mass was excised en bloc. This was sent to pathology.   I then irrigated the wound with 2 L of saline.  I infiltrated 30 mL of quarter percent Marcaine. I closed the wound in layers with interrupted 3-0 Vicryl sutures in deep and subcutaneous sutures. I closed the skin with 4-0 Monocryl suture. The wound was painted with Dermabond and sterilely dressed.   The patient tolerated  procedure well and was transported to recovery room in good condition. Sponge and needle count were correct.  Ovidio Kinavid Tynan Boesel, MD, [facility] Pager: [phone] Office phone:  [phone]

## 2018-01-13 NOTE — Transfer of Care (Signed)
Immediate Anesthesia Transfer of Care Note  Patient: Mallory Smith  Procedure(s) Performed: EXCISE CYST/MASS LEFT BREAST ERAS PATHWAY (Left Breast)  Patient Location: PACU  Anesthesia Type:General  Level of Consciousness: awake, alert , oriented and patient cooperative  Airway & Oxygen Therapy: Patient Spontanous Breathing and Patient connected to face mask oxygen  Post-op Assessment: Report given to RN and Post -op Vital signs reviewed and stable  Post vital signs: Reviewed and stable  Last Vitals:  Vitals Value Taken Time  BP    Temp    Pulse 86 01/13/2018  9:10 AM  Resp 13 01/13/2018  9:10 AM  SpO2 100 % 01/13/2018  9:10 AM    Last Pain:  Vitals:   01/13/18 0710  TempSrc: Oral  PainSc: 0-No pain      Patients Stated Pain Goal: 3 (01/13/18 0710)  Complications: No apparent anesthesia complications

## 2018-01-13 NOTE — Anesthesia Procedure Notes (Signed)
Procedure Name: LMA Insertion Date/Time: 01/13/2018 7:40 AM Performed by: Sheryn Bison, CRNA Pre-anesthesia Checklist: Patient identified, Emergency Drugs available, Suction available and Patient being monitored Patient Re-evaluated:Patient Re-evaluated prior to induction Oxygen Delivery Method: Circle system utilized Preoxygenation: Pre-oxygenation with 100% oxygen Induction Type: IV induction Ventilation: Mask ventilation without difficulty LMA: LMA inserted LMA Size: 4.0 Number of attempts: 1 Airway Equipment and Method: Bite block Placement Confirmation: positive ETCO2 Tube secured with: Tape Dental Injury: Teeth and Oropharynx as per pre-operative assessment

## 2018-01-13 NOTE — Anesthesia Postprocedure Evaluation (Signed)
Anesthesia Post Note  Patient: Mallory Smith  Procedure(s) Performed: EXCISE CYST/MASS LEFT BREAST ERAS PATHWAY (Left Breast)     Patient location during evaluation: PACU Anesthesia Type: General Level of consciousness: awake and alert Pain management: pain level controlled Vital Signs Assessment: post-procedure vital signs reviewed and stable Respiratory status: spontaneous breathing, nonlabored ventilation, respiratory function stable and patient connected to nasal cannula oxygen Cardiovascular status: blood pressure returned to baseline and stable Postop Assessment: no apparent nausea or vomiting Anesthetic complications: no    Last Vitals:  Vitals:   01/13/18 0945 01/13/18 1005  BP: 140/90 (!) 152/80  Pulse: 84 83  Resp: 18 16  Temp:  36.5 C  SpO2: 98% 97%    Last Pain:  Vitals:   01/13/18 1005  TempSrc:   PainSc: 0-No pain                 Avriana Joo COKER

## 2018-01-13 NOTE — Interval H&P Note (Signed)
History and Physical Interval Note:  01/13/2018 7:28 AM  Mallory Smith  has presented today for surgery, with the diagnosis of left breast cyst chronic  The various methods of treatment have been discussed with the patient and family.   Her daughter is with her.  After consideration of risks, benefits and other options for treatment, the patient has consented to  Procedure(s): EXCISE CYST/MASS LEFT BREAST ERAS PATHWAY (Left) as a surgical intervention .  The patient's history has been reviewed, patient examined, no change in status, stable for surgery.  I have reviewed the patient's chart and labs.  Questions were answered to the patient's satisfaction.     Kandis Cockingavid H Kazue Cerro

## 2018-01-13 NOTE — Anesthesia Preprocedure Evaluation (Signed)
Anesthesia Evaluation  Patient identified by MRN, date of birth, ID band Patient awake    Reviewed: Allergy & Precautions, NPO status , Patient's Chart, lab work & pertinent test results  Airway Mallampati: II  TM Distance: >3 FB Neck ROM: Full    Dental  (+) Teeth Intact, Dental Advisory Given   Pulmonary Current Smoker,    breath sounds clear to auscultation       Cardiovascular hypertension,  Rhythm:Regular Rate:Normal     Neuro/Psych    GI/Hepatic   Endo/Other    Renal/GU      Musculoskeletal   Abdominal   Peds  Hematology   Anesthesia Other Findings   Reproductive/Obstetrics                             Anesthesia Physical Anesthesia Plan  ASA: III  Anesthesia Plan:    Post-op Pain Management:    Induction: Intravenous  PONV Risk Score and Plan: Ondansetron, Dexamethasone and Propofol infusion  Airway Management Planned: LMA  Additional Equipment:   Intra-op Plan:   Post-operative Plan: Extubation in OR  Informed Consent:   Dental advisory given  Plan Discussed with:   Anesthesia Plan Comments:         Anesthesia Quick Evaluation

## 2018-01-13 NOTE — Discharge Instructions (Signed)
[facility] [place] [facility] - DISCHARGE INSTRUCTIONS TO PATIENT  Return to work on:  01/16/2018  Activity:  Driving - May drive tomorrow, if doing well, and taking one pill for pain or less per day.   Lifting - No lifting more than 15 pounds for 2 days, then no limit  Wound Care:   Leave bandage on until Sunday, then you may remove the bandage and shower  Diet:  As tolerated.  Follow up appointment:  Call Dr. Allene Pyo office [facility]) at [phone] for an appointment in 2 to 3 week.s  Medications and dosages:  Resume your home medications.  You have a prescription for:  Vicodin  Call Dr. Ezzard Standing or his office  [phone]) if you have:  Temperature greater than 100.4,  Persistent nausea and vomiting,  Severe uncontrolled pain,  Redness, tenderness, or signs of infection (pain, swelling, redness, odor or green/yellow discharge around the site),  Difficulty breathing, headache or visual disturbances,  Any other questions or concerns you may have after discharge.  In an emergency, call 911 or go to an Emergency Department at a nearby hospital.      Post Anesthesia Home Care Instructions  Activity: Get plenty of rest for the remainder of the day. A responsible individual must stay with you for 24 hours following the procedure.  For the next 24 hours, DO NOT: -Drive a car -Advertising copywriter -Drink alcoholic beverages -Take any medication unless instructed by your physician -Make any legal decisions or sign important papers.  Meals: Start with liquid foods such as gelatin or soup. Progress to regular foods as tolerated. Avoid greasy, spicy, heavy foods. If nausea and/or vomiting occur, drink only clear liquids until the nausea and/or vomiting subsides. Call your physician if vomiting continues.  Special Instructions/Symptoms: Your throat may feel dry or sore from the anesthesia or the breathing tube placed in your throat during surgery. If this causes  discomfort, gargle with warm salt water. The discomfort should disappear within 24 hours.  If you had a scopolamine patch placed behind your ear for the management of post- operative nausea and/or vomiting:  1. The medication in the patch is effective for 72 hours, after which it should be removed.  Wrap patch in a tissue and discard in the trash. Wash hands thoroughly with soap and water. 2. You may remove the patch earlier than 72 hours if you experience unpleasant side effects which may include dry mouth, dizziness or visual disturbances. 3. Avoid touching the patch. Wash your hands with soap and water after contact with the patch.

## 2018-01-13 NOTE — OR Nursing (Signed)
Brown pigmented lesion > 1 Cm  noted on patient's right anterior calf.  Dr. Ezzard Standing assessed area/lesion intraop prior to scheduled procedure.  To follow up with patient/family postoperatively regarding consult/possible excision.

## 2018-01-16 ENCOUNTER — Encounter (HOSPITAL_BASED_OUTPATIENT_CLINIC_OR_DEPARTMENT_OTHER): Payer: Self-pay | Admitting: Surgery

## 2018-06-14 IMAGING — CT CT ABD-PELV W/ CM
2 of 5 series · 15 of 46 positions shown, 17 images · IV contrast (Isovue)
Comparison: 12/11/2006

CLINICAL DATA: 55-year-old female with a history of upper abdominal
pain starting 2 weeks ago

EXAM:
CT ABDOMEN AND PELVIS WITH CONTRAST
TECHNIQUE: Multidetector CT imaging of the abdomen and pelvis was performed
using the standard protocol following bolus administration of
intravenous contrast.
CONTRAST:  100mL ZRT2K0-NDD IOPAMIDOL (ZRT2K0-NDD) INJECTION 61%

[Series 2: axial st · axial · 0.71mm/px · z∈[-496,-56]mm · 12 of 100 slices shown, 14 images]
[im 6/100  soft-tissue]
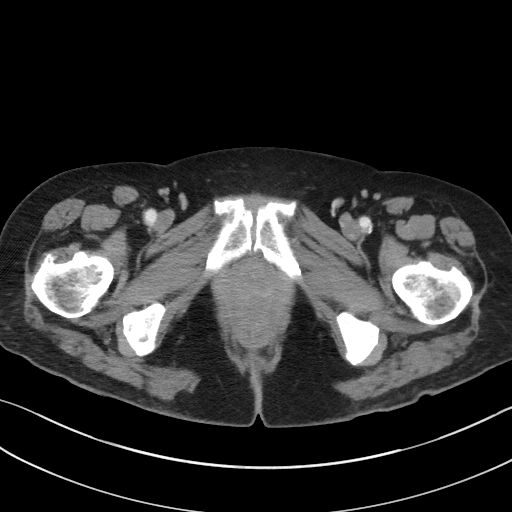
[im 6/100  bone]
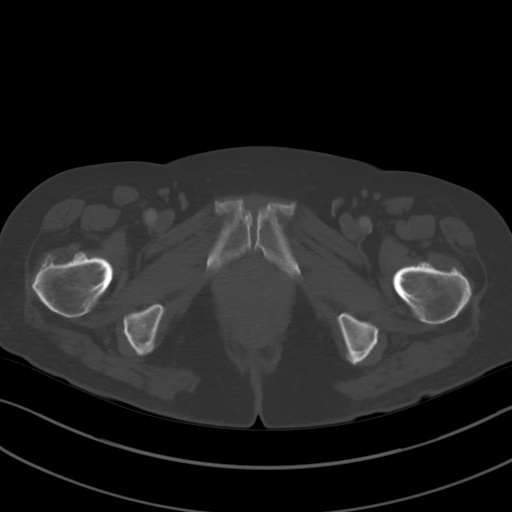
[im 16/100  soft-tissue]
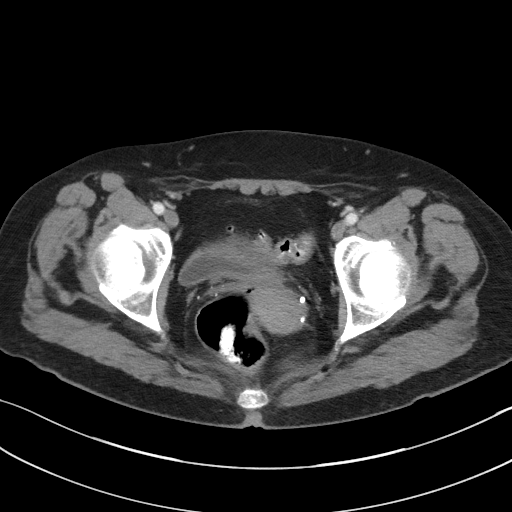
[im 21/100  soft-tissue]
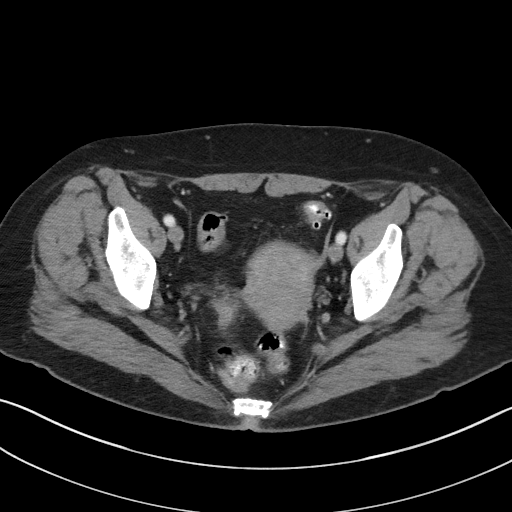
[im 32/100  soft-tissue]
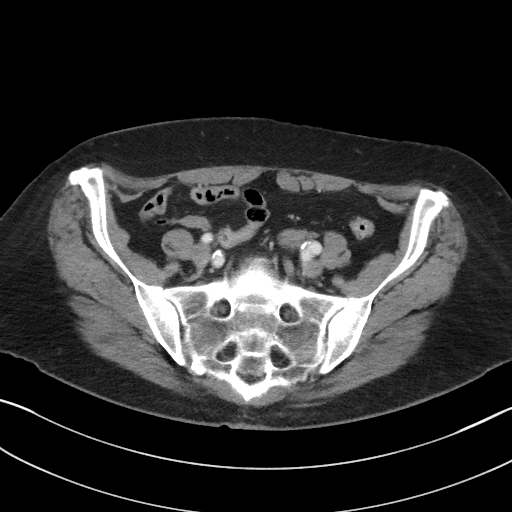
[im 37/100  soft-tissue]
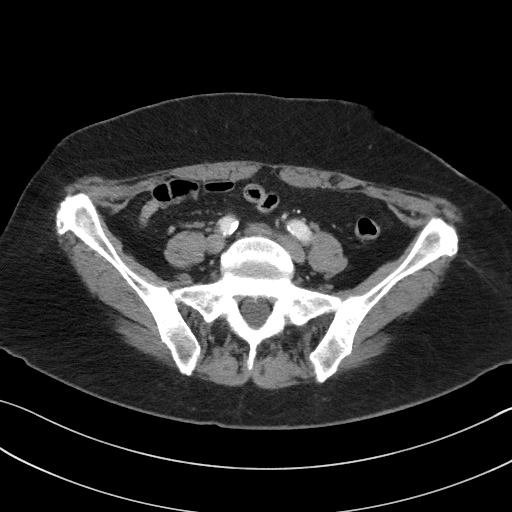
[im 47/100  soft-tissue]
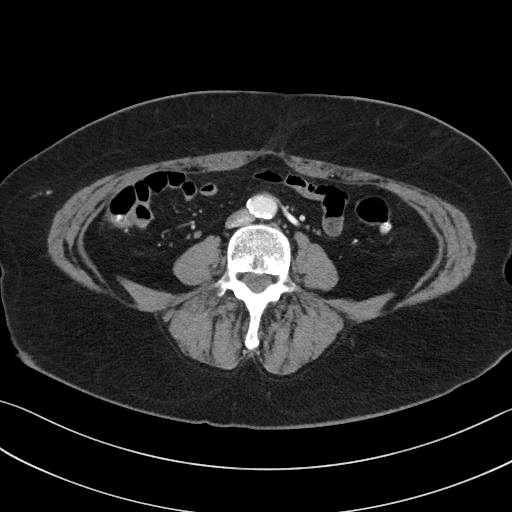
[im 53/100  soft-tissue]
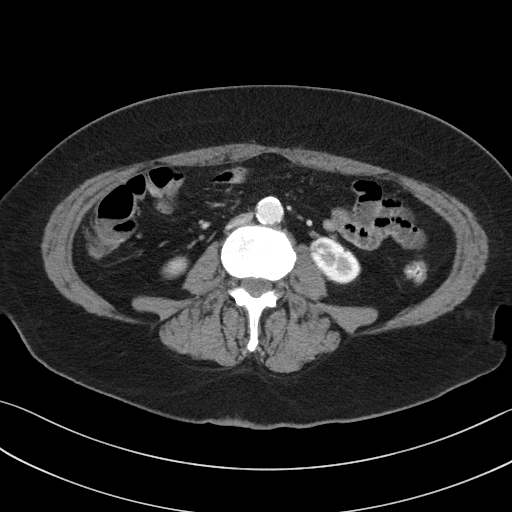
[im 63/100  soft-tissue]
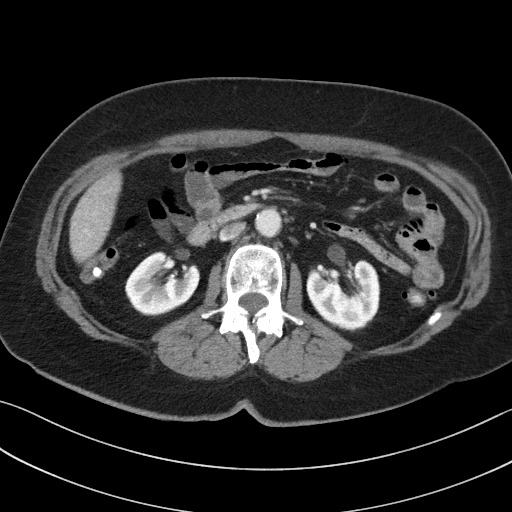
[im 68/100  soft-tissue]
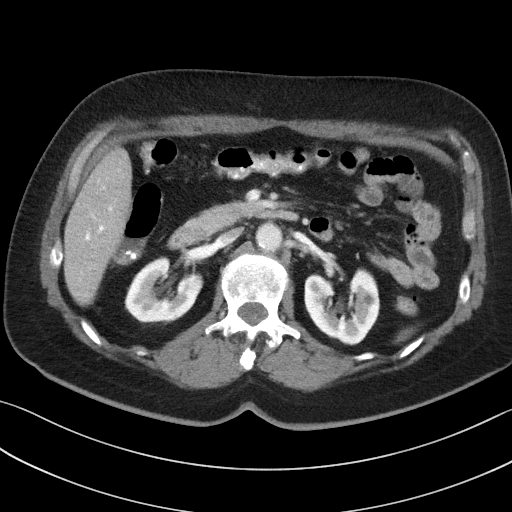
[im 68/100  bone]
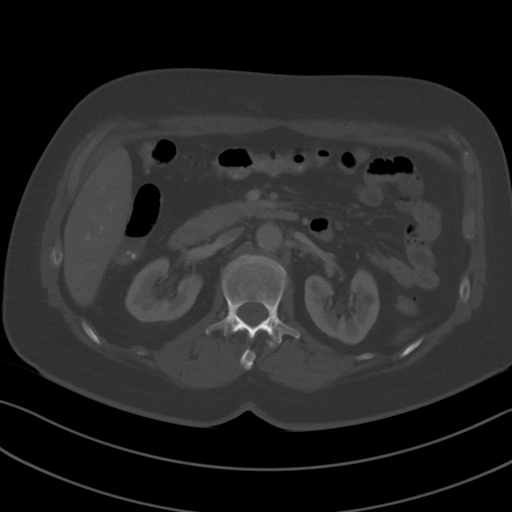
[im 79/100  soft-tissue]
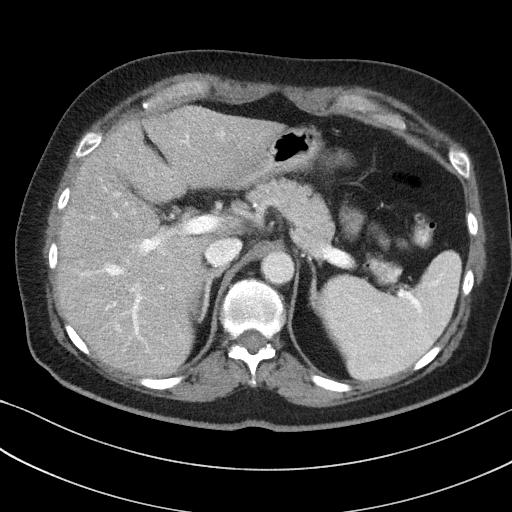
[im 84/100  soft-tissue]
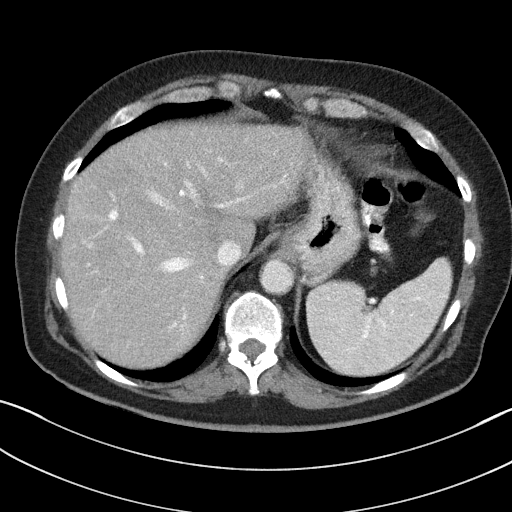
[im 94/100  soft-tissue]
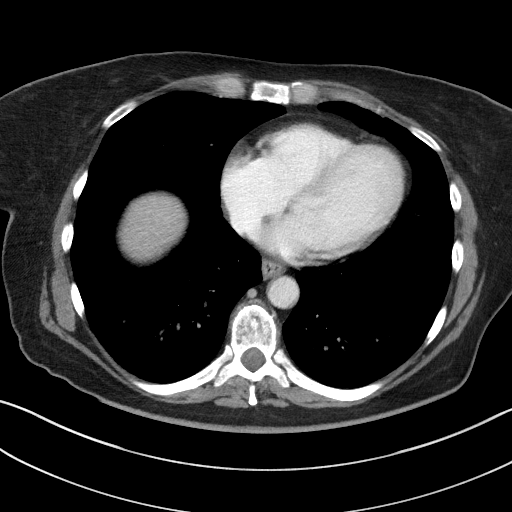

[Series 5: coronal st · coronal · 0.73mm/px · 3 of 96 slices shown]
[im 32/96  soft-tissue]
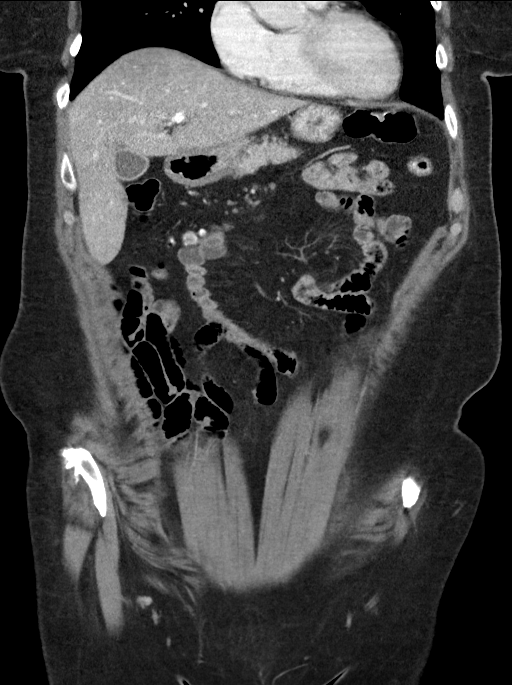
[im 43/96  soft-tissue]
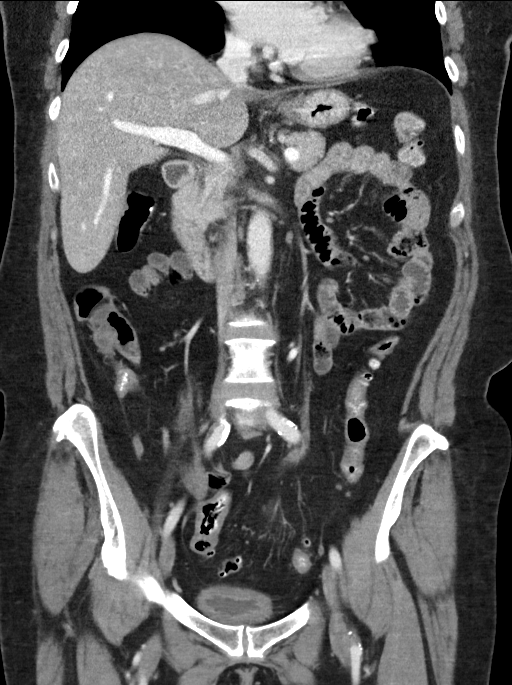
[im 53/96  soft-tissue]
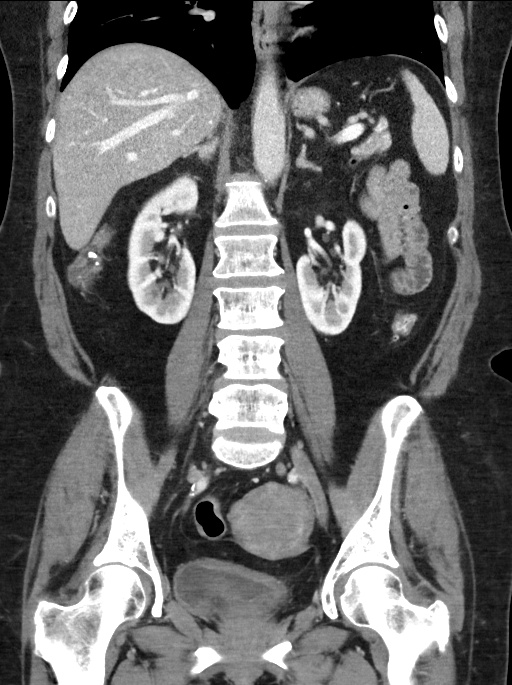

[15 of 46 positions shown; findings below may reference images not displayed]

FINDINGS: Lower chest: No acute abnormality.

Hepatobiliary: No focal liver abnormality is seen. No gallstones,
gallbladder wall thickening, or biliary dilatation.

Pancreas: Unremarkable. No pancreatic ductal dilatation or
surrounding inflammatory changes.

Spleen: Normal in size without focal abnormality.

Adrenals/Urinary Tract: Unremarkable appearance bilateral adrenal
glands.

Unremarkable right kidney without hydronephrosis or nephrolithiasis.
Perfusion symmetric to the right with no evidence of edema or
stranding. Unremarkable course the right ureter.

Unremarkable appearance the left kidney without hydronephrosis or
nephrolithiasis. Likely Bosniak 1 cyst on the lateral cortex. No
stranding or inflammatory changes. Unremarkable course of the left
kidney.

Compare to the prior CT there is circumferential urinary bladder
wall thickening. No radiopaque stones within the lumen.

Stomach/Bowel: Hiatal hernia. Unremarkable appearance the stomach.
Unremarkable small bowel with no transition point or abnormal
distention. Normal appendix. Colonic diverticula throughout the
length of the colon without associated inflammatory changes.

Vascular/Lymphatic: Mild soft plaque and calcified plaque of the
abdominal aorta. No aneurysm. No dissection. Calcifications extend
of the bilateral iliac system. Proximal femoral vasculature patent.

Small lymph nodes in the preaortic and para-aortic nodal stations
not enlarged.

Incidental note made of accessory left renal vein draining to the
proximal left common iliac vein.

Reproductive: Fibroid uterus. Unremarkable appearance of the adnexa.

Other: Fat containing umbilical hernia.

Musculoskeletal: Minimal degenerative changes of the hips.

No displaced fracture. Degenerative changes of the spine. No bony
canal narrowing.
IMPRESSION: Circumferential urinary bladder wall thickening which is new
compared to the CT of 8002. This can be seen in setting of
inflammatory/ infectious cystitis, and correlation with urinary
symptoms and urinalysis may be useful. There is no CT evidence of
pyelonephritis.

Small hiatal hernia.

Extensive colonic diverticular disease without diverticulitis.

Aortic Atherosclerosis ([id number]).

## 2019-01-18 ENCOUNTER — Encounter: Payer: Self-pay | Admitting: *Deleted

## 2019-03-29 ENCOUNTER — Telehealth: Payer: Self-pay | Admitting: *Deleted

## 2019-03-29 ENCOUNTER — Encounter: Payer: Self-pay | Admitting: *Deleted

## 2019-03-29 ENCOUNTER — Ambulatory Visit: Payer: BC Managed Care – PPO

## 2019-03-29 NOTE — Telephone Encounter (Signed)
Patient was a no show and letter sent  °

## 2019-03-29 NOTE — Telephone Encounter (Signed)
Noted  

## 2020-02-14 ENCOUNTER — Other Ambulatory Visit: Payer: Self-pay

## 2020-02-14 ENCOUNTER — Emergency Department (HOSPITAL_COMMUNITY)
Admission: EM | Admit: 2020-02-14 | Discharge: 2020-02-14 | Disposition: A | Discharge status: Home / Self Care | Payer: BC Managed Care – PPO | Attending: Emergency Medicine | Admitting: Emergency Medicine

## 2020-02-14 ENCOUNTER — Encounter (HOSPITAL_COMMUNITY): Payer: Self-pay

## 2020-02-14 DIAGNOSIS — Z5321 Procedure and treatment not carried out due to patient leaving prior to being seen by health care provider: Secondary | ICD-10-CM | POA: Insufficient documentation

## 2020-02-14 DIAGNOSIS — M549 Dorsalgia, unspecified: Secondary | ICD-10-CM | POA: Insufficient documentation

## 2020-02-14 NOTE — ED Notes (Signed)
Decided to leave , states she is tired of waiting

## 2020-02-14 NOTE — ED Triage Notes (Signed)
Pt presents to ED with complaints of left sided back pain shoots down left leg since Tuesday.

## 2024-01-10 ENCOUNTER — Other Ambulatory Visit (HOSPITAL_COMMUNITY): Payer: Self-pay | Admitting: Family Medicine

## 2024-01-10 DIAGNOSIS — Z1231 Encounter for screening mammogram for malignant neoplasm of breast: Secondary | ICD-10-CM

## 2024-01-11 ENCOUNTER — Encounter (INDEPENDENT_AMBULATORY_CARE_PROVIDER_SITE_OTHER): Payer: Self-pay | Admitting: *Deleted

## 2024-06-19 ENCOUNTER — Other Ambulatory Visit (HOSPITAL_COMMUNITY): Payer: Self-pay

## 2024-06-19 DIAGNOSIS — M545 Low back pain, unspecified: Secondary | ICD-10-CM

## 2024-06-28 ENCOUNTER — Ambulatory Visit (HOSPITAL_COMMUNITY)
Admission: RE | Admit: 2024-06-28 | Discharge: 2024-06-28 | Disposition: A | Discharge status: Home / Self Care | Source: Ambulatory Visit

## 2024-06-28 DIAGNOSIS — M545 Low back pain, unspecified: Secondary | ICD-10-CM | POA: Insufficient documentation

## 2024-07-02 ENCOUNTER — Other Ambulatory Visit (HOSPITAL_COMMUNITY): Payer: Self-pay

## 2024-07-02 DIAGNOSIS — M545 Low back pain, unspecified: Secondary | ICD-10-CM

## 2024-07-16 ENCOUNTER — Encounter (INDEPENDENT_AMBULATORY_CARE_PROVIDER_SITE_OTHER): Payer: Self-pay | Admitting: *Deleted

## 2024-07-19 ENCOUNTER — Ambulatory Visit (HOSPITAL_COMMUNITY)

## 2024-07-19 ENCOUNTER — Encounter (HOSPITAL_COMMUNITY): Payer: Self-pay

## 2024-08-28 NOTE — Therapy (Signed)
 " OUTPATIENT PHYSICAL THERAPY THORACOLUMBAR EVALUATION   Patient Name: Mallory Smith MRN: [id number] DOB:1961/04/11, 64 y.o., female Today's Date: 08/31/2024  END OF SESSION:    Past Medical History:  Diagnosis Date   Hypertension    Hyperthyroidism    Hypothyroidism    PONV (postoperative nausea and vomiting)    requesting same medication for anesthesia as last surgery   Thyroid  disease    Past Surgical History:  Procedure Laterality Date   INCISION AND DRAINAGE OF WOUND Left 10/18/2017   Procedure: IRRIGATION AND DEBRIDEMENT LEFT BREAST;  Surgeon: Ethyl Lenis, MD;  Location: [facility] OR;  Service: General;  Laterality: Left;   MASS EXCISION Left 01/13/2018   Procedure: EXCISE CYST/MASS LEFT BREAST ERAS PATHWAY;  Surgeon: Ethyl Lenis, MD;  Location: [facility];  Service: General;  Laterality: Left;   NM THYROID  STIM SUPRESS     TUBAL LIGATION     Patient Active Problem List   Diagnosis Date Noted   Cystitis 01/29/2017   Abdominal pain 01/29/2017   HYPOTHYROIDISM, POST-RADIOACTIVE IODINE 04/03/2009   FATIGUE 04/03/2009   GOITER, MULTINODULAR 11/28/2008   Thyrotoxicosis 05/30/2007   GERD 05/30/2007   Essential hypertension 03/28/2007    PCP: Shona Norleen PEDLAR, MD  REFERRING PROVIDER: Gladis Lauraine BRAVO, NP  REFERRING DIAG: M54.50 (ICD-10-CM) - Low back pain, unspecified  Rationale for Evaluation and Treatment: Rehabilitation  THERAPY DIAG:  Low back pain, unspecified back pain laterality, unspecified chronicity, unspecified whether sciatica present - Plan: PT plan of care cert/re-cert  Muscle weakness (generalized) - Plan: PT plan of care cert/re-cert  Radiculopathy, lumbar region - Plan: PT plan of care cert/re-cert  ONSET DATE: several months ago  SUBJECTIVE:                                                                                                                                                                                           SUBJECTIVE  STATEMENT: She retired a few months ago and the pain started about then; no specific injury.  Went to Dr. Milford office and saw Lauraine; did an xray and referred to physical therapy.  Pain in the low back that goes up to mid spine; sometimes neck hurts; sometimes pain into the legs; more left that right.  Sees her orthopedic MD tomorrow at [facility] in [place]  PERTINENT HISTORY:  HTN  PAIN:  Are you having pain? Yes: NPRS scale: 7/10 Pain location: low back up to thoracic spine; sometimes pain in the legs; mostly left Pain description: tight, aching, sore Aggravating factors: walking, standing, bending Relieving factors: ice, aspercream, celebrex , Tylenol  arthritis  PRECAUTIONS: None  WEIGHT BEARING RESTRICTIONS: No  FALLS:  Has patient fallen in last 6 months? No   OCCUPATION: retired; food services  PLOF: Independent  PATIENT GOALS: make pain go away; feel better  NEXT MD VISIT: PRN  OBJECTIVE:  Note: Objective measures were completed at Evaluation unless otherwise noted.  DIAGNOSTIC FINDINGS:  Narrative & Impression  CLINICAL DATA:  Low back pain   EXAM: LUMBAR SPINE - COMPLETE 4+ VIEW   COMPARISON:  07/31/2017   FINDINGS: Lumbar alignment within normal limits. Vertebral body heights are maintained. Mild osteophytes L1-L2 and L2-L3. Aortic atherosclerosis.   IMPRESSION: Mild degenerative changes.      PATIENT SURVEYS:  Modified Oswestry:  MODIFIED OSWESTRY DISABILITY SCALE  Date: 08/29/2024 Score                                Total 23/50; 46%   Interpretation of scores: Score Category Description  0-20% Minimal Disability The patient can cope with most living activities. Usually no treatment is indicated apart from advice on lifting, sitting and exercise  21-40% Moderate Disability The patient experiences more pain and difficulty with sitting, lifting and standing. Travel and social life are more difficult and they may be disabled from  work. Personal care, sexual activity and sleeping are not grossly affected, and the patient can usually be managed by conservative means  41-60% Severe Disability Pain remains the main problem in this group, but activities of daily living are affected. These patients require a detailed investigation  61-80% Crippled Back pain impinges on all aspects of the patients life. Positive intervention is required  81-100% Bed-bound These patients are either bed-bound or exaggerating their symptoms  Bluford FORBES Zoe DELENA Karon DELENA, et al. Surgery versus conservative management of stable thoracolumbar fracture: the PRESTO feasibility RCT. [place] ([place]): [employer]; 2021 Nov. [facility] Technology Assessment, No. 25.62.) Appendix 3, Oswestry Disability Index category descriptors. Available from: Findjewelers.cz  Minimally Clinically Important Difference (MCID) = 12.8%  COGNITION: Overall cognitive status: Within functional limits for tasks assessed     SENSATION: Occassionally hands and feet  MUSCLE LENGTH: Hamstrings:  POSTURE: rounded shoulders, forward head, and increased thoracic kyphosis  PALPATION: Very tight and tender thoracic and lumbar spine paraspinals  LUMBAR ROM:   AROM eval  Flexion Fingertips to 5 above ankles pulling  Extension 40% available  Right lateral flexion Fingertips to 1/3 top of thigh  Left lateral flexion Fingertips to mid thigh  Right rotation   Left rotation    (Blank rows = not tested)  LOWER EXTREMITY ROM:     Active  Right eval Left eval  Hip flexion    Hip extension    Hip abduction    Hip adduction    Hip internal rotation    Hip external rotation    Knee flexion    Knee extension    Ankle dorsiflexion    Ankle plantarflexion    Ankle inversion    Ankle eversion     (Blank rows = not tested)  LOWER EXTREMITY MMT:    MMT Right eval Left eval  Hip flexion 4+ 5  Hip extension 4-* 4*  Hip abduction     Hip adduction    Hip internal rotation    Hip external rotation    Knee flexion 4 4+  Knee extension 4+ 5  Ankle dorsiflexion 5 5  Ankle plantarflexion    Ankle inversion    Ankle eversion     (  Blank rows = not tested)   FUNCTIONAL TESTS:  5 times sit to stand: 25.65 sec using hands to push up to standing  GAIT: Distance walked: 50 ft in clinic Assistive device utilized: None Level of assistance: Modified independence Comments: decreased gait speed  TREATMENT DATE: 08/28/24 physical therapy evaluation and HEP instruction                                                                                                                                 PATIENT EDUCATION:  Education details: Patient educated on exam findings, POC, scope of PT, HEP, and what to expect next visit. Person educated: Patient Education method: Explanation, Demonstration, and Handouts Education comprehension: verbalized understanding, returned demonstration, verbal cues required, and tactile cues required  HOME EXERCISE PROGRAM: Decompression exercises  ASSESSMENT:  CLINICAL IMPRESSION: Patient is a 64 y.o. female who was seen today for physical therapy evaluation and treatment for M54.50 (ICD-10-CM) - Low back pain, unspecified.  Patient demonstrates muscle weakness, reduced ROM, and fascial restrictions which are likely contributing to symptoms of pain and are negatively impacting patient ability to perform ADLs and functional mobility tasks. Patient will benefit from skilled physical therapy services to address these deficits to reduce pain and improve level of function with ADLs and functional mobility tasks.   OBJECTIVE IMPAIRMENTS: Abnormal gait, decreased activity tolerance, decreased ROM, decreased strength, impaired perceived functional ability, and pain.   ACTIVITY LIMITATIONS: carrying, lifting, bending, sitting, standing, sleeping, and locomotion level  PARTICIPATION LIMITATIONS: meal prep,  cleaning, laundry, driving, shopping, and community activity   REHAB POTENTIAL: Good  CLINICAL DECISION MAKING: Evolving/moderate complexity  EVALUATION COMPLEXITY: Moderate   GOALS: Goals reviewed with patient? No  SHORT TERM GOALS: Target date: 09/12/18  patient will be independent with initial HEP and compliant with HEP 3-4 times a week   Baseline: Goal status: INITIAL  2.  Patient will report 50% improvement overall  Baseline:  Goal status: INITIAL   LONG TERM GOALS: Target date: 09/26/2024  Patient will be independent in self management strategies to improve quality of life and functional outcomes.  Baseline:  Goal status: INITIAL  2.  Patient will report 70% improvement overall  Baseline:  Goal status: INITIAL  3.  Patient will improve modified Oswestry score by 5 points to demonstrate improved perceived function  Baseline: 23/50 Goal status: INITIAL  4.  Patient will improve 5 times sit to stand score to 20 sec or less to demonstrate improved functional mobility and increased leg strength.    Baseline: 25.65 Goal status: INITIAL  5.   Patient will increase bilateral leg MMT's to 5/5 to allow navigation of steps without gait deviation or loss of balance  Baseline: see today Goal status: INITIAL  PLAN:  PT FREQUENCY: 2x/week  PT DURATION: 4 weeks  PLANNED INTERVENTIONS: 97164- PT Re-evaluation, 97110-Therapeutic exercises, 97530- Therapeutic activity, 97112- Neuromuscular re-education, 97535- Self Care, 02859- Manual therapy, [id number]- Gait training, [phone]- Orthotic  Fit/training, 04007- Canalith repositioning, 02886- Aquatic Therapy, [id number]- Splinting, 02402- Wound care (first 20 sq cm), 97598- Wound care (each additional 20 sq cm)Patient/Family education, Balance training, Stair training, Taping, Dry Needling, Joint mobilization, Joint manipulation, Spinal manipulation, Spinal mobilization, Scar mobilization, and DME instructions. SABRA  PLAN FOR NEXT  SESSION: Review of HEP and goals; decompression exercises; postural strengthening; lumbar and thoracic mobility  7:57 AM, 08/31/24 Aundrey Elahi Small Brennan Litzinger MPT [facility] [place] [phone] [phone]   Managed Medicaid Authorization Request Treatment Start Date: 2024/08/30  Visit Dx Codes: M54.50, M62.81, M54.16  Functional Tool Score: Modified Oswestry; 23/50; 46%  For all possible CPT codes, reference the Planned Interventions line above.     Check all conditions that are expected to impact treatment: {Conditions expected to impact treatment:None of these apply   If treatment provided at initial evaluation, no treatment charged due to lack of authorization.       "

## 2024-08-29 ENCOUNTER — Other Ambulatory Visit: Payer: Self-pay

## 2024-08-29 ENCOUNTER — Ambulatory Visit (HOSPITAL_COMMUNITY)

## 2024-08-29 DIAGNOSIS — M5416 Radiculopathy, lumbar region: Secondary | ICD-10-CM | POA: Insufficient documentation

## 2024-08-29 DIAGNOSIS — M545 Low back pain, unspecified: Secondary | ICD-10-CM | POA: Insufficient documentation

## 2024-08-29 DIAGNOSIS — M6281 Muscle weakness (generalized): Secondary | ICD-10-CM | POA: Diagnosis present

## 2024-09-06 ENCOUNTER — Other Ambulatory Visit (HOSPITAL_COMMUNITY): Payer: Self-pay

## 2024-09-06 DIAGNOSIS — R399 Unspecified symptoms and signs involving the genitourinary system: Secondary | ICD-10-CM

## 2024-09-06 DIAGNOSIS — I700 Atherosclerosis of aorta: Secondary | ICD-10-CM

## 2024-09-07 ENCOUNTER — Encounter (HOSPITAL_COMMUNITY): Payer: Self-pay

## 2024-09-07 ENCOUNTER — Ambulatory Visit (HOSPITAL_COMMUNITY)

## 2024-09-12 ENCOUNTER — Ambulatory Visit (HOSPITAL_COMMUNITY)

## 2024-09-13 ENCOUNTER — Ambulatory Visit (HOSPITAL_COMMUNITY)
Admission: RE | Admit: 2024-09-13 | Discharge: 2024-09-13 | Disposition: A | Discharge status: Home / Self Care | Source: Ambulatory Visit

## 2024-09-13 ENCOUNTER — Ambulatory Visit (HOSPITAL_COMMUNITY)
Admission: RE | Admit: 2024-09-13 | Discharge: 2024-09-13 | Disposition: A | Discharge status: Home / Self Care | Payer: Self-pay | Source: Ambulatory Visit

## 2024-09-13 DIAGNOSIS — R399 Unspecified symptoms and signs involving the genitourinary system: Secondary | ICD-10-CM | POA: Diagnosis present

## 2024-09-13 DIAGNOSIS — I700 Atherosclerosis of aorta: Secondary | ICD-10-CM | POA: Insufficient documentation

## 2024-09-14 ENCOUNTER — Ambulatory Visit
Admission: RE | Admit: 2024-09-14 | Discharge: 2024-09-14 | Disposition: A | Discharge status: Home / Self Care | Source: Ambulatory Visit | Attending: Physician Assistant | Admitting: Physician Assistant

## 2024-09-14 ENCOUNTER — Ambulatory Visit (HOSPITAL_COMMUNITY)

## 2024-09-14 VITALS — BP 133/83 | HR 70 | Temp 97.6°F | Resp 16

## 2024-09-14 DIAGNOSIS — R1030 Lower abdominal pain, unspecified: Secondary | ICD-10-CM | POA: Insufficient documentation

## 2024-09-14 DIAGNOSIS — N3090 Cystitis, unspecified without hematuria: Secondary | ICD-10-CM

## 2024-09-14 DIAGNOSIS — N3000 Acute cystitis without hematuria: Secondary | ICD-10-CM | POA: Diagnosis present

## 2024-09-14 LAB — POCT URINE DIPSTICK
Bilirubin, UA: NEGATIVE
Blood, UA: NEGATIVE
Glucose, UA: NEGATIVE mg/dL
Ketones, POC UA: NEGATIVE mg/dL
Nitrite, UA: NEGATIVE
Spec Grav, UA: 1.025
Urobilinogen, UA: 0.2 U/dL
pH, UA: 6

## 2024-09-14 MED ORDER — SULFAMETHOXAZOLE-TRIMETHOPRIM 800-160 MG PO TABS: 1.0000 | ORAL_TABLET | Freq: Two times a day (BID) | ORAL | 0 refills | Status: AC

## 2024-09-14 MED ORDER — CEFTRIAXONE SODIUM 1 G IJ SOLR
1.0000 g | Freq: Once | INTRAMUSCULAR | Status: AC
  Administered 2024-09-14: 1 g via INTRAMUSCULAR

## 2024-09-14 NOTE — Discharge Instructions (Signed)
 We are treating you for urinary tract infection.  Please start Bactrim  DS twice daily for 7 days.  If you develop any rash or oral lesions stop the medication and be seen immediately.  Make sure you rest and drink plenty of fluid.  I will contact you if need to stop or change your antibiotics based on your culture results.  If anything worsens and you have abdominal pain, blood in your urine, nausea, vomiting, fever, weakness, persistent or worsening symptoms you should be seen immediately.

## 2024-09-14 NOTE — ED Provider Notes (Signed)
 " [facility]    CSN: [id number] Arrival date & time: 09/14/24  0810      History   Chief Complaint No chief complaint on file.   HPI Mallory Smith is a 64 y.o. female.   Patient presents today with a several week history of intermittent lower abdominal discomfort and urinary symptoms.  She has been seen by her primary care at which point they noted blood in her urine and sent her for a renal ultrasound that was obtained yesterday (09/13/2024) that showed bladder wall thickening concerning for cystitis but no calculus or structural abnormality.  She reports associated frequency, urgency, dysuria.  Denies any hematuria, vomiting, severe abdominal pain.  She does have some flank and back pain but reports that she also has a history of degenerative disc disease and is currently followed by [facility] and receiving physical therapy for chronic back pain so is unsure if urinary symptoms are contributing to ongoing pain.  She denies any recent antibiotic use.  She denies any recent angina procedure, catheterization.  Denies history of diabetes has not taken SGLT2 inhibitor.    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   FATIGUE 04/03/2009   GOITER, MULTINODULAR 11/28/2008   Thyrotoxicosis 05/30/2007   GERD 05/30/2007   Essential hypertension 03/28/2007    Past Surgical History:  Procedure Laterality Date   INCISION AND DRAINAGE OF WOUND Left 10/18/2017   Procedure: IRRIGATION AND DEBRIDEMENT LEFT BREAST;  Surgeon: Ethyl Lenis, MD;  Location: [facility] OR;  Service: General;  Laterality: Left;   MASS EXCISION Left 01/13/2018   Procedure: EXCISE  CYST/MASS LEFT BREAST ERAS PATHWAY;  Surgeon: Ethyl Lenis, MD;  Location: [facility];  Service: General;  Laterality: Left;   NM THYROID  STIM SUPRESS     TUBAL LIGATION      OB History     Gravida  3   Para  3   Term  3   Preterm      AB      Living  3      SAB      IAB      Ectopic      Multiple      Live Births               Home Medications    Prior to Admission medications  Medication Sig Start Date End Date Taking? Authorizing Provider  sulfamethoxazole -trimethoprim  (BACTRIM  DS) 800-160 MG tablet Take 1 tablet by mouth 2 (two) times daily for 7 days. 09/14/24 09/21/24 Yes Sutton Plake K, PA-C  acetaminophen  (TYLENOL ) 500 MG tablet Take 1,000 mg by mouth every 6 (six) hours as needed for pain.     [provider]  ALPRAZolam  (XANAX ) 0.25 MG tablet Take 0.25 mg by mouth at bedtime as needed for anxiety or sleep.  09/21/14   [provider]  BIOTIN PO Take 1 tablet by mouth daily.    [provider]  cyanocobalamin 500 MCG tablet Take 500 mcg by mouth daily.    [provider]  levothyroxine  (SYNTHROID , LEVOTHROID) 137 MCG tablet Take 137 mcg by mouth daily before breakfast.  [provider]  meloxicam (MOBIC) 15 MG tablet Take 15 mg by mouth daily.    [provider]  metoprolol  tartrate (LOPRESSOR ) 25 MG tablet Take 1 tablet by mouth 2 (two) times daily. 10/14/14   [provider]  Multiple Vitamin (MULTIVITAMIN WITH MINERALS) TABS tablet Take 1 tablet by mouth daily.    [provider]  omeprazole (PRILOSEC) 20 MG capsule Take 20 mg by mouth daily.    [provider]  POTASSIUM PO Take 1 tablet by mouth daily.    [provider]  pseudoephedrine  (SUDAFED) 30 MG tablet Take 30 mg by mouth daily as needed for congestion.    [provider]    Family History Family History  Problem Relation Age of Onset   Diabetes Other    Stroke Other      Social History Social History[1]   Allergies   Patient has no known allergies.   Review of Systems Review of Systems  Constitutional:  Negative for activity change, appetite change, fatigue and fever.  Gastrointestinal:  Positive for abdominal pain (lower) and nausea. Negative for diarrhea and vomiting.  Genitourinary:  Positive for dysuria, frequency and urgency. Negative for hematuria, vaginal bleeding, vaginal discharge and vaginal pain.  Musculoskeletal:  Positive for back pain. Negative for arthralgias and myalgias.     Physical Exam Triage Vital Signs ED Triage Vitals  Encounter Vitals Group     BP 09/14/24 0829 133/83     Girls Systolic BP Percentile --      Girls Diastolic BP Percentile --      Boys Systolic BP Percentile --      Boys Diastolic BP Percentile --      Pulse Rate 09/14/24 0829 70     Resp 09/14/24 0829 16     Temp 09/14/24 0829 97.6 F (36.4 C)     Temp Source 09/14/24 0829 Oral     SpO2 09/14/24 0829 95 %     Weight --      Height --      Head Circumference --      Peak Flow --      Pain Score 09/14/24 0828 10     Pain Loc --      Pain Education --      Exclude from Growth Chart --    No data found.  Updated Vital Signs BP 133/83 (BP Location: Right Arm)   Pulse 70   Temp 97.6 F (36.4 C) (Oral)   Resp 16   SpO2 95%   Visual Acuity Right Eye Distance:   Left Eye Distance:   Bilateral Distance:    Right Eye Near:   Left Eye Near:    Bilateral Near:     Physical Exam Vitals reviewed.  Constitutional:      General: She is awake. She is not in acute distress.    Appearance: Normal appearance. She is well-developed. She is not ill-appearing.     Comments: Very pleasant female appears stated age in no acute distress sitting comfortably in exam room  HENT:     Head: Normocephalic and atraumatic.  Cardiovascular:     Rate and Rhythm: Normal rate and regular rhythm.     Heart sounds: Normal heart sounds, S1 normal and S2  normal. No murmur heard. Pulmonary:     Effort: Pulmonary effort is normal.     Breath sounds: Normal breath sounds. No wheezing, rhonchi or rales.     Comments: Clear to auscultation bilaterally  Abdominal:     General: Bowel sounds are normal.     Palpations: Abdomen is soft.     Tenderness: There is no abdominal tenderness. There is no right CVA tenderness, left CVA tenderness, guarding or rebound.     Comments: Benign abdominal exam.  No significant tenderness palpation on exam or CVA tenderness.  Psychiatric:        Behavior: Behavior is cooperative.      UC Treatments / Results  Labs (all labs ordered are listed, but only abnormal results are displayed) Labs Reviewed  POCT URINE DIPSTICK - Abnormal; Notable for the following components:      Result Value   Clarity, UA cloudy (*)    Leukocytes, UA Small (1+) (*)    All other components within normal limits  URINE CULTURE    EKG   Radiology US  RENAL Result Date: 09/14/2024 CLINICAL DATA:  Initial evaluation for UTI symptoms. EXAM: RENAL / URINARY TRACT ULTRASOUND COMPLETE COMPARISON:  Comparison made with prior CT from 01/29/2017. FINDINGS: Right Kidney: Renal measurements: 13.4 x 5.0 x 5.4 cm = volume: 189.4 mL. Renal echogenicity within normal limits. No nephrolithiasis or hydronephrosis. 1.5 x 1.3 x 1.3 cm simple cyst present at the lower pole. Left Kidney: Renal measurements: 12.5 x 4.7 x 5.2 cm = volume: 160.6 mL. Renal echogenicity within normal limits. No nephrolithiasis or hydronephrosis. 2.5 x 3.0 x 2.0 cm simple cyst present at the interpolar region. Bladder: Circumferential wall thickening about the bladder, somewhat greater than expected for degree of distension. Other: None. IMPRESSION: 1. Circumferential bladder wall thickening, greater than expected for degree of distension. Correlation with urinalysis recommended to evaluate for possible cystitis. 2. Simple bilateral renal cysts measuring up to 3.0 cm on the left  and 1.5 cm on the right. These are benign in appearance, with no follow-up imaging recommended regarding these lesions. 3. No nephrolithiasis or hydronephrosis. Electronically Signed   By: Morene Hoard M.D.   On: 09/14/2024 05:26    Procedures Procedures (including critical care time)  Medications Ordered in UC Medications  cefTRIAXone  (ROCEPHIN ) injection 1 g (1 g Intramuscular Given 09/14/24 0911)    Initial Impression / Assessment and Plan / UC Course  I have reviewed the triage vital signs and the nursing notes.  Pertinent labs & imaging results that were available during my care of the patient were reviewed by me and considered in my medical decision making (see chart for details).     Patient is well-appearing, afebrile, nontoxic, nontachycardic.  UA is concerning for UTI and so we will start antibiotics given her several weeks of symptoms with an abnormal urinalysis.  She was given 1 g of Rocephin  in clinic and will start Bactrim  DS as this would penetrate kidney parenchyma if she is developing pyelonephritis though I have a low suspicion of this given her physical exam today.  No indication for dose adjustment based on metabolic panel available in Care Everywhere from 06/12/2024 with a creatinine of 1.21 and calculated creatinine clearance of 78 mL/min.  Will send urine for culture and we will contact her if we need to stop or change her antibiotics based on culture results.  Recommend that she push fluids.  She is to follow-up with her primary care as scheduled.  We discussed that if anything worsens and she has severe abdominal pain, difficulty passing urine, fever, nausea/vomiting, weakness she needs to be seen emergently.  Return precautions given.  Patient expressed understanding and agreement to treatment plan.  Final  Clinical Impressions(s) / UC Diagnoses   Final diagnoses:  Acute cystitis without hematuria  Lower abdominal pain     Discharge Instructions      We  are treating you for urinary tract infection.  Please start Bactrim  DS twice daily for 7 days.  If you develop any rash or oral lesions stop the medication and be seen immediately.  Make sure you rest and drink plenty of fluid.  I will contact you if need to stop or change your antibiotics based on your culture results.  If anything worsens and you have abdominal pain, blood in your urine, nausea, vomiting, fever, weakness, persistent or worsening symptoms you should be seen immediately.       ED Prescriptions     Medication Sig Dispense Auth. Provider   sulfamethoxazole -trimethoprim  (BACTRIM  DS) 800-160 MG tablet Take 1 tablet by mouth 2 (two) times daily for 7 days. 14 tablet Season Astacio K, PA-C      PDMP not reviewed this encounter.    [1]  Social History Tobacco Use   Smoking status: Every Day    Current packs/day: 0.25    Average packs/day: 0.3 packs/day for 20.0 years (5.0 ttl pk-yrs)    Types: Cigarettes   Smokeless tobacco: Never  Vaping Use   Vaping status: Never Used  Substance Use Topics   Alcohol use: Yes    Comment: occ   Drug use: No     Galan Ghee, Rocky POUR, PA-C 09/14/24 0930  "

## 2024-09-14 NOTE — ED Triage Notes (Addendum)
 Pt states for the past 2 weeks she has been having lower back pain. States she went to her primary doctor and had blood in her urine and then she was sent to the hospital for scans. States the scans did not show anything.  Pt states she is still having the pain.  States she has been taking tylenol  at home for the pain.

## 2024-09-17 ENCOUNTER — Ambulatory Visit (HOSPITAL_COMMUNITY): Payer: Self-pay

## 2024-09-17 LAB — URINE CULTURE: Culture: 50000 — AB

## 2024-09-18 ENCOUNTER — Ambulatory Visit (HOSPITAL_COMMUNITY): Admitting: Physical Therapy

## 2024-09-18 MED ORDER — CEFDINIR 300 MG PO CAPS: 300.0000 mg | ORAL_CAPSULE | Freq: Two times a day (BID) | ORAL | 0 refills | Status: AC

## 2024-09-20 ENCOUNTER — Ambulatory Visit (HOSPITAL_COMMUNITY)

## 2024-09-25 ENCOUNTER — Ambulatory Visit (HOSPITAL_COMMUNITY)

## 2024-09-27 ENCOUNTER — Ambulatory Visit (HOSPITAL_COMMUNITY)

## 2024-10-02 ENCOUNTER — Ambulatory Visit (HOSPITAL_COMMUNITY): Admitting: Physical Therapy

## 2024-10-04 ENCOUNTER — Ambulatory Visit (HOSPITAL_COMMUNITY)

## 2024-10-23 ENCOUNTER — Ambulatory Visit: Admitting: Dermatology

## 2024-11-26 ENCOUNTER — Ambulatory Visit: Admitting: Internal Medicine
